# Patient Record
Sex: Male | Born: 1965 | Race: White | Hispanic: No | Marital: Married | Smoking: Former smoker
Health system: Southern US, Community
[De-identification: ages and names within clinical notes are randomized; demographics above are authoritative.]

## PROBLEM LIST (undated history)

## (undated) DIAGNOSIS — E785 Hyperlipidemia, unspecified: Secondary | ICD-10-CM

## (undated) DIAGNOSIS — M1990 Unspecified osteoarthritis, unspecified site: Secondary | ICD-10-CM

## (undated) DIAGNOSIS — T7840XA Allergy, unspecified, initial encounter: Secondary | ICD-10-CM

## (undated) DIAGNOSIS — B019 Varicella without complication: Secondary | ICD-10-CM

## (undated) DIAGNOSIS — R011 Cardiac murmur, unspecified: Secondary | ICD-10-CM

## (undated) HISTORY — DX: Unspecified osteoarthritis, unspecified site: M19.90

## (undated) HISTORY — DX: Varicella without complication: B01.9

## (undated) HISTORY — DX: Allergy, unspecified, initial encounter: T78.40XA

## (undated) HISTORY — DX: Cardiac murmur, unspecified: R01.1

## (undated) HISTORY — DX: Hyperlipidemia, unspecified: E78.5

## (undated) HISTORY — PX: LASIK: SHX215

---
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
## 2006-04-03 HISTORY — PX: OTHER SURGICAL HISTORY: SHX169

## 2010-12-09 ENCOUNTER — Ambulatory Visit (INDEPENDENT_AMBULATORY_CARE_PROVIDER_SITE_OTHER): Payer: 59 | Admitting: Family Medicine

## 2010-12-09 ENCOUNTER — Encounter: Payer: Self-pay | Admitting: Family Medicine

## 2010-12-09 DIAGNOSIS — M542 Cervicalgia: Secondary | ICD-10-CM

## 2010-12-09 DIAGNOSIS — E785 Hyperlipidemia, unspecified: Secondary | ICD-10-CM | POA: Insufficient documentation

## 2010-12-09 DIAGNOSIS — M7650 Patellar tendinitis, unspecified knee: Secondary | ICD-10-CM

## 2010-12-09 NOTE — Patient Instructions (Signed)
Consider icing knee 2-3 times daily for 20 to 30 minutes per application. We will call you regarding PT appointment.

## 2010-12-09 NOTE — Progress Notes (Signed)
  Subjective:    Patient ID: Damon Hughes, male    DOB: [birth year], 45 y.o.   MRN: [id number]  HPI New patient to establish care. Past medical history significant for hyperlipidemia. He has occasional seasonal allergies. Reported past history of heart murmur. He had some presumed arthritis in his neck but has never had MRI scan. Takes simvastatin 40 mg daily for hyperlipidemia. Also low-dose aspirin. Surgical history significant for appendectomy and tonsillectomy. Nasal polyp removal 2008.  Family history significant for father with psoriatic arthritis. Hyperlipidemia and hypertension in mother. Grandparent with stroke.  Patient is married. Works in [employer]. Nonsmoker. Occasional alcohol usually one glass of wine per day  Recent problems with right knee pain. Location is anterior knee. No injury. Tenderness over patellar tendon. Advil without much relief. Has not tried icing. No locking or giving way. Sometimes painful after prolonged periods of sitting. Does not feel pain is underneath the kneecap. No regular exercise.  Chronic neck pain. Intermittent episodes of tingling right upper extremity sometimes exacerbated by turning head. No weakness. Has tried Advil and stretches without relief. No history of physical therapy. Right-hand-dominant.   Review of Systems  Constitutional: Negative for fever, chills, appetite change and unexpected weight change.  HENT: Positive for neck pain. Negative for neck stiffness.   Eyes: Negative for visual disturbance.  Respiratory: Negative for cough and shortness of breath.   Cardiovascular: Negative for chest pain.  Gastrointestinal: Negative for abdominal pain.  Musculoskeletal: Positive for arthralgias. Negative for myalgias and gait problem.  Skin: Negative for rash.       Objective:   Physical Exam  Constitutional: He is oriented to person, place, and time. He appears well-developed and well-nourished.  HENT:  Right Ear:  External ear normal.  Left Ear: External ear normal.  Mouth/Throat: Oropharynx is clear and moist.  Eyes: Pupils are equal, round, and reactive to light.  Neck: Neck supple. No thyromegaly present.       Full range of motion neck. He has some soft tissue muscular tenderness right trapezius and right paracervical.  Cardiovascular: Normal rate, regular rhythm and normal heart sounds.   No murmur heard. Pulmonary/Chest: Effort normal and breath sounds normal. No respiratory distress. He has no wheezes. He has no rales.  Musculoskeletal: He exhibits no edema.  Lymphadenopathy:    He has no cervical adenopathy.  Neurological: He is alert and oriented to person, place, and time. No cranial nerve deficit.       Strength full upper extremities. Symmetric reflexes.  Right knee reveals no effusion. No erythema or warmth. Full range of motion. He has some mild prepatellar and patellar tenderness. No joint line tenderness. Ligament testing is normal          Assessment & Plan:  #1 right knee patellar tendinitis. Recommend regular icing. Continue Advil as needed. Consider addition of Voltaren gel if persists #2 right neck pain. Intermittent paresthesias right upper extremity.  Trial of physical therapy. #3 hyperlipidemia on simvastatin. Repeat labs with physical this fall

## 2011-01-04 ENCOUNTER — Other Ambulatory Visit (INDEPENDENT_AMBULATORY_CARE_PROVIDER_SITE_OTHER): Payer: 59

## 2011-01-04 DIAGNOSIS — Z0000 Encounter for general adult medical examination without abnormal findings: Secondary | ICD-10-CM

## 2011-01-04 LAB — LIPID PANEL
Cholesterol: 193 mg/dL (ref 0–200)
HDL: 46.1 mg/dL (ref >39.00)
LDL Cholesterol: 120 mg/dL — ABNORMAL HIGH (ref 0–99)
VLDL: 26.8 mg/dL (ref 0.0–40.0)

## 2011-01-04 LAB — POCT URINALYSIS DIPSTICK
Ketones, UA: NEGATIVE
Leukocytes, UA: NEGATIVE
Nitrite, UA: NEGATIVE
Protein, UA: NEGATIVE
Urobilinogen, UA: 0.2
pH, UA: 6

## 2011-01-04 LAB — CBC WITH DIFFERENTIAL/PLATELET
Eosinophils Relative: 3.6 % (ref 0.0–5.0)
HCT: 44.6 % (ref 39.0–52.0)
Hemoglobin: 14.5 g/dL (ref 13.0–17.0)
Lymphocytes Relative: 26.5 % (ref 12.0–46.0)
Lymphs Abs: 2 10*3/uL (ref 0.7–4.0)
Monocytes Relative: 7.3 % (ref 3.0–12.0)
Neutro Abs: 4.7 10*3/uL (ref 1.4–7.7)
WBC: 7.7 10*3/uL (ref 4.5–10.5)

## 2011-01-04 LAB — HEPATIC FUNCTION PANEL
ALT: 17 U/L (ref 0–53)
Bilirubin, Direct: 0 mg/dL (ref 0.0–0.3)
Total Protein: 7.3 g/dL (ref 6.0–8.3)

## 2011-01-04 LAB — BASIC METABOLIC PANEL
Calcium: 9.7 mg/dL (ref 8.4–10.5)
GFR: 86.33 mL/min (ref >60.00)
Potassium: 4.2 mEq/L (ref 3.5–5.1)
Sodium: 143 mEq/L (ref 135–145)

## 2011-01-04 LAB — TSH: TSH: 1.37 u[IU]/mL (ref 0.35–5.50)

## 2011-01-11 ENCOUNTER — Encounter: Payer: Self-pay | Admitting: Family Medicine

## 2011-01-11 ENCOUNTER — Ambulatory Visit (INDEPENDENT_AMBULATORY_CARE_PROVIDER_SITE_OTHER): Payer: 59 | Admitting: Family Medicine

## 2011-01-11 DIAGNOSIS — M25569 Pain in unspecified knee: Secondary | ICD-10-CM

## 2011-01-11 DIAGNOSIS — M25561 Pain in right knee: Secondary | ICD-10-CM

## 2011-01-11 DIAGNOSIS — E785 Hyperlipidemia, unspecified: Secondary | ICD-10-CM

## 2011-01-11 DIAGNOSIS — Z0000 Encounter for general adult medical examination without abnormal findings: Secondary | ICD-10-CM

## 2011-01-11 DIAGNOSIS — Z23 Encounter for immunization: Secondary | ICD-10-CM

## 2011-01-11 MED ORDER — SIMVASTATIN 40 MG PO TABS: 40.0000 mg | ORAL_TABLET | Freq: Every day | ORAL | Status: DC

## 2011-01-11 NOTE — Progress Notes (Signed)
Subjective:    Patient ID: Damon Hughes, male    DOB: 1965/06/05, 45 y.o.   MRN: [id number]  HPI Patient here for complete physical. Hyperlipidemia treated with simvastatin. No side effects. Needs refills. Last tetanus unknown but he thinks around 2004. No flu vaccine yet. Past medical history, family history, and social history reviewed as below.  No consistent exercise. Upper back pain and neck pain improved following physical therapy. Ongoing right knee pain. Pain is proximal patellar tendon region. Denies injury. Pain for several months is somewhat progressive. Icing without improvement. No effusion. Exacerbated by direct pressure on the knee.  Past Medical History  Diagnosis Date  . Arthritis   . Chicken pox   . Allergy   . Heart murmur   . Hyperlipidemia    Past Surgical History  Procedure Date  . Tonsillectomy 1974  . Appendectomy 1976  . Nasal polyps 2008  . Lasik     reports that he has quit smoking. He does not have any smokeless tobacco history on file. He reports that he drinks alcohol. He reports that he does not use illicit drugs. family history includes Alcohol abuse in his other; Arthritis in his father and other; Cancer in his other; Heart disease (age of onset:75) in his mother; Hyperlipidemia in his mother and other; Hypertension in his father and other; and Stroke in his other. No Known Allergies    Review of Systems  Constitutional: Negative for fever, activity change, appetite change, fatigue and unexpected weight change.  HENT: Negative for ear pain, congestion and trouble swallowing.   Eyes: Negative for pain and visual disturbance.  Respiratory: Negative for cough, shortness of breath and wheezing.   Cardiovascular: Negative for chest pain and palpitations.  Gastrointestinal: Negative for nausea, vomiting, abdominal pain, diarrhea, constipation, blood in stool, abdominal distention and rectal pain.  Genitourinary: Negative for dysuria, hematuria and  testicular pain.  Musculoskeletal: Negative for myalgias, back pain, joint swelling and arthralgias.  Skin: Negative for rash.  Neurological: Negative for dizziness, syncope and headaches.  Hematological: Negative for adenopathy.  Psychiatric/Behavioral: Negative for confusion and dysphoric mood.       Objective:   Physical Exam  Constitutional: He is oriented to person, place, and time. He appears well-developed and well-nourished. No distress.  HENT:  Head: Normocephalic and atraumatic.  Right Ear: External ear normal.  Left Ear: External ear normal.  Mouth/Throat: Oropharynx is clear and moist.  Eyes: Conjunctivae and EOM are normal. Pupils are equal, round, and reactive to light.  Neck: Normal range of motion. Neck supple. No thyromegaly present.  Cardiovascular: Normal rate, regular rhythm and normal heart sounds.   No murmur heard. Pulmonary/Chest: No respiratory distress. He has no wheezes. He has no rales.  Abdominal: Soft. Bowel sounds are normal. He exhibits no distension and no mass. There is no tenderness. There is no rebound and no guarding.  Musculoskeletal: He exhibits no edema.       Right knee excellent range of motion. No effusion. No medial or lateral joint line tenderness. Somewhat nodular thickening proximal patellar tendon with slight tenderness to palpation. No erythema or warmth. No ecchymosis.  Lymphadenopathy:    He has no cervical adenopathy.  Neurological: He is alert and oriented to person, place, and time. He displays normal reflexes. No cranial nerve deficit.  Skin: No rash noted.  Psychiatric: He has a normal mood and affect.          Assessment & Plan:  #1 health maintenance. Tetanus booster  given and flu vaccine given. Reviewed labs with patient. Minimally elevated lipids. Reduce saturated fats. Refill simvastatin for one year. Staples were consistent exercise. #2 right knee pain. Location is anterior knee and suspect patellar tendon. Not  improved with icing. Nodular thickening. Orthopedic referral.

## 2011-01-11 NOTE — Progress Notes (Signed)
Addended by: Melchor Amour on: 01/11/2011 09:53 AM   Modules accepted: Orders

## 2011-01-11 NOTE — Patient Instructions (Signed)
Low-Fat, Low-Saturated-Fat, Low-Cholesterol Diets Food Selection Guide BREADS, CEREALS, PASTA, RICE, DRIED PEAS, AND BEANS These products are high in carbohydrates and most are low in fat. Therefore, they can be increased in the diet as substitutes for fatty foods. They too, however, contain calories and should not be eaten in excess. Cereals can be eaten for snacks as well as for breakfast.  Include foods that contain fiber (fruits, vegetables, whole grains, and legumes). Research shows that fiber may lower blood cholesterol levels, especially the water-soluble fiber found in fruits, vegetables, oat products, and legumes. FRUITS AND VEGETABLES It is good to eat fruits and vegetables. Besides being sources of fiber, both are rich in vitamins and some minerals. They help you get the daily allowances of these nutrients. Fruits and vegetables can be used for snacks and desserts. MEATS Limit lean meat, chicken, [place], and fish to no more than 6 ounces per day. Beef, Pork, and Lamb  Use lean cuts of beef, pork, and lamb. Lean cuts include:   Extra-lean ground beef.   Arm roast.   Sirloin tip.   Center-cut ham.   Round steak.   Loin chops.   Rump roast.   Tenderloin.  Trim all fat off the outside of meats before cooking. It is not necessary to severely decrease the intake of red meat, but lean choices should be made. Lean meat is rich in protein and contains a highly absorbable form of iron. Premenopausal women, in particular, should avoid reducing lean red meat because this could increase the risk for low red blood cells (iron-deficiency anemia). Processed Meats Processed meats, such as bacon, bologna, salami, sausage, and hot dogs contain large quantities of fat, are not rich in valuable nutrients, and should not be eaten very often. Organ Meats The organ meats, such as liver, sweetbreads, kidneys, and brain are very rich in cholesterol. They should be limited. Chicken and [place] These  are good sources of protein. The fat of poultry can be reduced by removing the skin and underlying fat layers before cooking. Chicken and [place] can be substituted for lean red meat in the diet. Poultry should not be fried or covered with high-fat sauces. Fish and Shellfish Fish is a good source of protein. Shellfish contain cholesterol, but they usually are low in saturated fatty acids. The preparation of fish is important. Like chicken and [place], they should not be fried or covered with high-fat sauces. EGGS Egg yolks often are hidden in cooked and processed foods. Egg whites contain no fat or cholesterol. They can be eaten often. Try 1 to 2 egg whites instead of whole eggs in recipes or use egg substitutes that do not contain yolk. MILK AND DAIRY PRODUCTS Use skim or 1% milk instead of 2% or whole milk. Decrease whole milk, natural, and processed cheeses. Use nonfat or low-fat (2%) cottage cheese or low-fat cheeses made from vegetable oils. Choose nonfat or low-fat (1 to 2%) yogurt. Experiment with evaporated skim milk in recipes that call for heavy cream. Substitute low-fat yogurt or low-fat cottage cheese for sour cream in dips and salad dressings. Have at least 2 servings of low-fat dairy products, such as 2 glasses of skim (or 1%) milk each day to help get your daily calcium intake. FATS AND OILS Reduce the total intake of fats, especially saturated fat. Butterfat, lard, and beef fats are high in saturated fat and cholesterol. These should be avoided as much as possible. Vegetable fats do not contain cholesterol, but certain vegetable fats, such as  coconut oil, palm oil, and palm kernel oil are very high in saturated fats. These should be limited. These fats are often used in bakery goods, processed foods, popcorn, oils, and nondairy creamers. Vegetable shortenings and some peanut butters contain hydrogenated oils, which are also saturated fats. Read the labels on these foods and check for saturated  vegetable oils. Unsaturated vegetable oils and fats do not raise blood cholesterol. However, they should be limited because they are fats and are high in calories. Total fat should still be limited to 30% of your daily caloric intake. Desirable liquid vegetable oils are corn oil, cottonseed oil, olive oil, canola oil, safflower oil, soybean oil, and sunflower oil. Peanut oil is not as good, but small amounts are acceptable. Buy a heart-healthy tub margarine that has no partially hydrogenated oils in the ingredients. Mayonnaise and salad dressings often are made from unsaturated fats, but they should also be limited because of their high calorie and fat content. Seeds, nuts, peanut butter, olives, and avocados are high in fat, but the fat is mainly the unsaturated type. These foods should be limited mainly to avoid excess calories and fat. OTHER EATING TIPS Snacks  Most sweets should be limited as snacks. They tend to be rich in calories and fats, and their caloric content outweighs their nutritional value. Some good choices in snacks are graham crackers, melba toast, soda crackers, bagels (no egg), English muffins, fruits, and vegetables. These snacks are preferable to snack crackers, [place] fries, and chips. Popcorn should be air-popped or cooked in small amounts of liquid vegetable oil. Desserts Eat fruit, low-fat yogurt, and fruit ices instead of pastries, cake, and cookies. Sherbet, angel food cake, gelatin dessert, frozen low-fat yogurt, or other frozen products that do not contain saturated fat (pure fruit juice bars, frozen ice pops) are also acceptable.  COOKING METHODS Choose those methods that use little or no fat. They include:  Poaching.   Braising.   Steaming.   Grilling.   Baking.   Stir-frying.   Broiling.   Microwaving.  Foods can be cooked in a nonstick pan without added fat, or use a nonfat cooking spray in regular cookware. Limit fried foods and avoid frying in saturated  fat. Add moisture to lean meats by using water, broth, cooking wines, and other nonfat or low-fat sauces along with the cooking methods mentioned above. Soups and stews should be chilled after cooking. The fat that forms on top after a few hours in the refrigerator should be skimmed off. When preparing meals, avoid using excess salt. Salt can contribute to raising blood pressure in some people. EATING AWAY FROM HOME Order entres, potatoes, and vegetables without sauces or butter. When meat exceeds the size of a deck of cards (3 to 4 ounces), the rest can be taken home for another meal. Choose vegetable or fruit salads and ask for low-calorie salad dressings to be served on the side. Use dressings sparingly. Limit high-fat toppings, such as bacon, crumbled eggs, cheese, sunflower seeds, and olives. Ask for heart-healthy tub margarine instead of butter. Document Released: 09/09/2001 Document Re-Released: 06/14/2009 ExitCare Patient Information 2011 ExitCare, [facility] more regular exercise  Try to establish more consistent exercise.

## 2011-05-05 ENCOUNTER — Ambulatory Visit (INDEPENDENT_AMBULATORY_CARE_PROVIDER_SITE_OTHER): Payer: 59 | Admitting: Licensed Clinical Social Worker

## 2011-05-05 DIAGNOSIS — F4323 Adjustment disorder with mixed anxiety and depressed mood: Secondary | ICD-10-CM

## 2011-05-19 ENCOUNTER — Ambulatory Visit (INDEPENDENT_AMBULATORY_CARE_PROVIDER_SITE_OTHER): Payer: 59 | Admitting: Licensed Clinical Social Worker

## 2011-05-19 DIAGNOSIS — F4323 Adjustment disorder with mixed anxiety and depressed mood: Secondary | ICD-10-CM

## 2011-06-02 ENCOUNTER — Ambulatory Visit (INDEPENDENT_AMBULATORY_CARE_PROVIDER_SITE_OTHER): Payer: 59 | Admitting: Licensed Clinical Social Worker

## 2011-06-02 DIAGNOSIS — F4323 Adjustment disorder with mixed anxiety and depressed mood: Secondary | ICD-10-CM

## 2011-06-16 ENCOUNTER — Ambulatory Visit (INDEPENDENT_AMBULATORY_CARE_PROVIDER_SITE_OTHER): Payer: 59 | Admitting: Licensed Clinical Social Worker

## 2011-06-16 DIAGNOSIS — F4323 Adjustment disorder with mixed anxiety and depressed mood: Secondary | ICD-10-CM

## 2011-06-26 ENCOUNTER — Ambulatory Visit (INDEPENDENT_AMBULATORY_CARE_PROVIDER_SITE_OTHER): Payer: 59 | Admitting: Licensed Clinical Social Worker

## 2011-06-26 DIAGNOSIS — F4323 Adjustment disorder with mixed anxiety and depressed mood: Secondary | ICD-10-CM

## 2011-07-17 ENCOUNTER — Ambulatory Visit (INDEPENDENT_AMBULATORY_CARE_PROVIDER_SITE_OTHER): Payer: 59 | Admitting: Licensed Clinical Social Worker

## 2011-07-17 DIAGNOSIS — F4323 Adjustment disorder with mixed anxiety and depressed mood: Secondary | ICD-10-CM

## 2011-07-28 ENCOUNTER — Ambulatory Visit (INDEPENDENT_AMBULATORY_CARE_PROVIDER_SITE_OTHER): Payer: 59 | Admitting: Licensed Clinical Social Worker

## 2011-07-28 DIAGNOSIS — F4323 Adjustment disorder with mixed anxiety and depressed mood: Secondary | ICD-10-CM

## 2011-08-16 ENCOUNTER — Ambulatory Visit: Payer: 59 | Admitting: Licensed Clinical Social Worker

## 2011-12-22 ENCOUNTER — Other Ambulatory Visit: Payer: Self-pay | Admitting: Family Medicine

## 2012-02-27 ENCOUNTER — Other Ambulatory Visit: Payer: Self-pay | Admitting: Family Medicine

## 2012-02-27 ENCOUNTER — Other Ambulatory Visit: Payer: Self-pay | Admitting: *Deleted

## 2012-02-27 MED ORDER — SIMVASTATIN 40 MG PO TABS: 40.0000 mg | ORAL_TABLET | Freq: Every day | ORAL | Status: DC

## 2012-05-02 ENCOUNTER — Ambulatory Visit (INDEPENDENT_AMBULATORY_CARE_PROVIDER_SITE_OTHER): Payer: 59 | Admitting: Family Medicine

## 2012-05-02 VITALS — BP 124/88 | Temp 97.8°F | Wt 216.0 lb

## 2012-05-02 DIAGNOSIS — E785 Hyperlipidemia, unspecified: Secondary | ICD-10-CM

## 2012-05-02 LAB — HEPATIC FUNCTION PANEL
AST: 19 U/L (ref 0–37)
Albumin: 4.5 g/dL (ref 3.5–5.2)
Alkaline Phosphatase: 73 U/L (ref 39–117)
Total Protein: 7.5 g/dL (ref 6.0–8.3)

## 2012-05-02 LAB — LIPID PANEL
Cholesterol: 199 mg/dL (ref 0–200)
Triglycerides: 127 mg/dL (ref 0.0–149.0)

## 2012-05-02 MED ORDER — SIMVASTATIN 40 MG PO TABS: 40.0000 mg | ORAL_TABLET | Freq: Every day | ORAL | Status: DC

## 2012-05-02 NOTE — Patient Instructions (Addendum)
Try to lose some weight and monitor blood pressure with goal < 140/90

## 2012-05-02 NOTE — Progress Notes (Signed)
Quick Note:  Pt informed on personally identified VM ______ 

## 2012-05-02 NOTE — Progress Notes (Signed)
  Subjective:    Patient ID: Damon Hughes, male    DOB: May 19, 1965, 47 y.o.   MRN: [id number]  HPI Followup hyperlipidemia. Patient takes simvastatin 40 mg daily. No myalgias. No history of CAD. No history of hypertension. Has gained some weight over the past couple of years. No consistent exercise. Mother had coronary disease but she was in her 70s. Patient is nonsmoker. Denies any chest pains. No dizziness.  Past Medical History  Diagnosis Date  . Arthritis   . Chicken pox   . Allergy   . Heart murmur   . Hyperlipidemia    Past Surgical History  Procedure Date  . Tonsillectomy 1974  . Appendectomy 1976  . Nasal polyps 2008  . Lasik     reports that he has quit smoking. He does not have any smokeless tobacco history on file. He reports that he drinks alcohol. He reports that he does not use illicit drugs. family history includes Alcohol abuse in his other; Arthritis in his father and other; Cancer in his other; Heart disease (age of onset:75) in his mother; Hyperlipidemia in his mother and other; Hypertension in his father and other; and Stroke in his other. No Known Allergies    Review of Systems  Constitutional: Negative for fatigue and unexpected weight change.  Eyes: Negative for visual disturbance.  Respiratory: Negative for cough, chest tightness and shortness of breath.   Cardiovascular: Negative for chest pain, palpitations and leg swelling.  Neurological: Negative for dizziness, syncope, weakness, light-headedness and headaches.       Objective:   Physical Exam  Constitutional: He appears well-developed and well-nourished. No distress.  HENT:  Mouth/Throat: Oropharynx is clear and moist.  Neck: Neck supple. No thyromegaly present.  Cardiovascular: Normal rate and regular rhythm.   Pulmonary/Chest: Effort normal and breath sounds normal. No respiratory distress. He has no wheezes. He has no rales.  Musculoskeletal: He exhibits no edema.  Lymphadenopathy:   He has no cervical adenopathy.          Assessment & Plan:  Hyperlipidemia. Recheck lipid and hepatic panel. Refill simvastatin for one year Work on weight loss. Monitor blood pressure closely

## 2012-09-17 ENCOUNTER — Ambulatory Visit (INDEPENDENT_AMBULATORY_CARE_PROVIDER_SITE_OTHER)
Admission: RE | Admit: 2012-09-17 | Discharge: 2012-09-17 | Disposition: A | Discharge status: Home / Self Care | Payer: 59 | Source: Ambulatory Visit | Attending: Family Medicine | Admitting: Family Medicine

## 2012-09-17 ENCOUNTER — Encounter: Payer: Self-pay | Admitting: Family Medicine

## 2012-09-17 ENCOUNTER — Ambulatory Visit (INDEPENDENT_AMBULATORY_CARE_PROVIDER_SITE_OTHER): Payer: 59 | Admitting: Family Medicine

## 2012-09-17 VITALS — BP 120/90 | Temp 97.8°F | Wt 203.0 lb

## 2012-09-17 DIAGNOSIS — R0789 Other chest pain: Secondary | ICD-10-CM

## 2012-09-17 DIAGNOSIS — R071 Chest pain on breathing: Secondary | ICD-10-CM

## 2012-09-17 DIAGNOSIS — M25519 Pain in unspecified shoulder: Secondary | ICD-10-CM

## 2012-09-17 DIAGNOSIS — G8929 Other chronic pain: Secondary | ICD-10-CM

## 2012-09-17 MED ORDER — MELOXICAM 15 MG PO TABS: 15.0000 mg | ORAL_TABLET | Freq: Every day | ORAL | Status: DC

## 2012-09-17 NOTE — Progress Notes (Signed)
  Subjective:    Patient ID: Damon Hughes, male    DOB: [birth year], 47 y.o.   MRN: [id number]  HPI  Patient seen with 2-3 month history of left chest wall pain intermittently. Never related to exertion and in fact he's been walking and swimming some recently with no symptoms. Location is left upper chest wall region. Described as achy quality and relatively constant with exacerbations. No clear exacerbating factors. No history of injury. No pleuritic pain. Denies dyspnea or cough. No recent appetite or weight changes. Taken some Advil without much relief. Denies any left shoulder pain or left cervical radiculopathy pains  Patient also relates some persistent right periscapular pains. Worse with shoulder movement. No history of injury. Tried shoulder and back massage without improvement. Denies any upper extremity weakness.  Patient nonsmoker. Takes simvastatin for hyperlipidemia also baby aspirin one daily  Past Medical History  Diagnosis Date  . Arthritis   . Chicken pox   . Allergy   . Heart murmur   . Hyperlipidemia    Past Surgical History  Procedure Laterality Date  . Tonsillectomy  1974  . Appendectomy  1976  . Nasal polyps  2008  . Lasik      reports that he has quit smoking. He does not have any smokeless tobacco history on file. He reports that  drinks alcohol. He reports that he does not use illicit drugs. family history includes Alcohol abuse in his other; Arthritis in his father and other; Cancer in his other; Heart disease (age of onset: 38) in his mother; Hyperlipidemia in his mother and other; Hypertension in his father and other; and Stroke in his other. No Known Allergies   Review of Systems  Constitutional: Negative for fever, chills, appetite change, fatigue and unexpected weight change.  Respiratory: Negative for cough and shortness of breath.   Cardiovascular: Negative for palpitations and leg swelling.  Gastrointestinal: Negative for abdominal pain.   Musculoskeletal: Negative for joint swelling.  Skin: Negative for rash.  Neurological: Negative for dizziness.  Hematological: Negative for adenopathy.       Objective:   Physical Exam  Constitutional: He appears well-developed and well-nourished.  Neck: Neck supple. No thyromegaly present.  No cervical lymphadenopathy. No supraclavicular or axillary adenopathy  Cardiovascular: Normal rate and regular rhythm.   Pulmonary/Chest: Effort normal and breath sounds normal. No respiratory distress. He has no wheezes. He has no rales.  Chest wall is nontender to palpation. No breast mass  Abdominal: Soft. There is no tenderness.  Musculoskeletal: He exhibits no edema.  Full range of motion both shoulders. He has some minimal tenderness palpating medial aspect of right scapula. No evidence for scoliosis. No spinal tenderness.          Assessment & Plan:  #1 left chest wall pain. No history of any exertional symptoms, dyspnea, or other concerning findings by history or exam. Given duration of symptoms, chest x-ray though he does not have any significant respiratory symptoms. Suspect musculoskeletal origin. Trial of meloxicam 15 mg once daily and touch base 2 weeks if no improvement #2 right periscapular pain. Chest x-ray as above. Touch base if meloxicam not helping

## 2012-09-17 NOTE — Patient Instructions (Addendum)
Touch base in 2 weeks if pain not improved left chest wall or right scapular region.

## 2013-02-06 ENCOUNTER — Other Ambulatory Visit: Payer: Self-pay

## 2013-04-03 HISTORY — PX: WRIST SURGERY: SHX841

## 2013-04-17 ENCOUNTER — Other Ambulatory Visit: Payer: Self-pay | Admitting: Family Medicine

## 2013-05-08 ENCOUNTER — Encounter: Payer: Self-pay | Admitting: Family Medicine

## 2013-05-08 ENCOUNTER — Ambulatory Visit (INDEPENDENT_AMBULATORY_CARE_PROVIDER_SITE_OTHER): Payer: 59 | Admitting: Family Medicine

## 2013-05-08 VITALS — BP 128/82 | HR 79 | Temp 97.9°F | Wt 216.0 lb

## 2013-05-08 DIAGNOSIS — G8929 Other chronic pain: Secondary | ICD-10-CM

## 2013-05-08 DIAGNOSIS — M25511 Pain in right shoulder: Principal | ICD-10-CM

## 2013-05-08 DIAGNOSIS — M25519 Pain in unspecified shoulder: Secondary | ICD-10-CM

## 2013-05-08 NOTE — Progress Notes (Signed)
   Subjective:    Patient ID: Damon PatienceJeffrey W. Vogl, male    DOB: Aug 30, 1965, 48 y.o.   MRN: [id number]  Neck Pain  Pertinent negatives include no fever, numbness or weakness.   Patient seen with right periscapular pains. Present for several months. He denies specific injury. Occasional pains at the base of the cervical spine but mostly his pain  t involvinghe medial aspect of the right scapula and worse with movement (internal rotation) of the shoulder. He does not have any soreness to touch. This has been going on for several months and he has tried multiple things including nonsteroidals, heat, ice, muscle massage, physical therapy, heat patches, and topical icy hot without improvement. His symptoms are relatively stable. He does not have any significant pain at rest. No pleuritic pain. No dyspnea. No cough. No skin rashes.  He notices a" popping" sound when he rotates his right shoulder.  Past Medical History  Diagnosis Date  . Arthritis   . Chicken pox   . Allergy   . Heart murmur   . Hyperlipidemia    Past Surgical History  Procedure Laterality Date  . Tonsillectomy  1974  . Appendectomy  1976  . Nasal polyps  2008  . Lasik      reports that he has quit smoking. He does not have any smokeless tobacco history on file. He reports that he drinks alcohol. He reports that he does not use illicit drugs. family history includes Alcohol abuse in his other; Arthritis in his father and other; Cancer in his other; Heart disease (age of onset: 6075) in his mother; Hyperlipidemia in his mother and other; Hypertension in his father and other; Stroke in his other. No Known Allergies    Review of Systems  Constitutional: Negative for fever and chills.  Respiratory: Negative for cough and shortness of breath.   Musculoskeletal: Positive for neck pain.  Neurological: Negative for weakness and numbness.  Hematological: Negative for adenopathy.       Objective:   Physical Exam  Constitutional:  He appears well-developed and well-nourished.  Cardiovascular: Normal rate and regular rhythm.   Pulmonary/Chest: Effort normal and breath sounds normal. No respiratory distress. He has no wheezes. He has no rales.  Musculoskeletal:  Full range of motion right shoulder. He has no localized tenderness in the back region but has popping sensation when rotating the right shoulder on the medial aspect of the scapula.  Full range of motion cervical spine.  Neurological:  Full-strength upper extremities. Symmetric reflexes.  Skin: No rash noted.          Assessment & Plan:  Chronic, several month history of right medial periscapular pain.  Unimproved with multiple conservative therapies as above. Set up sports medicine referral for further evaluation

## 2013-05-08 NOTE — Progress Notes (Signed)
Pre visit review using our clinic review tool, if applicable. No additional management support is needed unless otherwise documented below in the visit note. 

## 2013-05-08 NOTE — Patient Instructions (Signed)
We will call you with Sports Medicine referral. 

## 2013-05-15 ENCOUNTER — Ambulatory Visit (INDEPENDENT_AMBULATORY_CARE_PROVIDER_SITE_OTHER): Payer: 59 | Admitting: Family Medicine

## 2013-05-15 ENCOUNTER — Encounter: Payer: Self-pay | Admitting: Family Medicine

## 2013-05-15 VITALS — BP 122/76 | HR 66 | Temp 98.1°F | Resp 16 | Wt 214.0 lb

## 2013-05-15 DIAGNOSIS — M9981 Other biomechanical lesions of cervical region: Secondary | ICD-10-CM

## 2013-05-15 DIAGNOSIS — M546 Pain in thoracic spine: Secondary | ICD-10-CM

## 2013-05-15 DIAGNOSIS — M999 Biomechanical lesion, unspecified: Secondary | ICD-10-CM | POA: Insufficient documentation

## 2013-05-15 DIAGNOSIS — M62838 Other muscle spasm: Secondary | ICD-10-CM

## 2013-05-15 NOTE — Assessment & Plan Note (Signed)
Running patient is having some neck spasm secondary to poor posture as well as working conditions. We discussed different home exercises as well as range of motion exercises they can be beneficial. We discussed over-the-counter medicines they can be helpful. Patient will try these different interventions and come back again in 2 weeks. Patient did respond well to osteopathic manipulation and will likely continue.

## 2013-05-15 NOTE — Progress Notes (Signed)
Pre-visit discussion using our clinic review tool. No additional management support is needed unless otherwise documented below in the visit note.  

## 2013-05-15 NOTE — Assessment & Plan Note (Signed)
Patient stated that this is the best is felt in multiple years after the osteopathic manipulation. Patient given home exercises to work on upper back musculature. Discussed icing for: Over-the-counter medicines. Patient will come back again in 2-3 weeks for further manipulation.

## 2013-05-15 NOTE — Patient Instructions (Signed)
Good to meet you Vitamin D 2000 IU daily.  Turmeric 500mg  Twice daily.  Exercises most days of the week focusing on upper back muscles such as rows Posture on wall 5 min goal a day with heels, butt, shoulder and head against wall.  Tennis ball between shoulder blades when sitting Change monitor to eye height.  Come back in 2 weeks.

## 2013-05-15 NOTE — Assessment & Plan Note (Signed)
Decision today to treat with OMT was based on Physical Exam  After verbal consent patient was treated with HVLA, ME and FPR techniques in cervical, thoracic and rib areas  Patient tolerated the procedure well with improvement in symptoms  Patient given exercises, stretches and lifestyle modifications  See medications in patient instructions if given  Patient will follow up in 2-3 weeks

## 2013-05-15 NOTE — Progress Notes (Signed)
  Tawana ScaleZach Kamarian Sahakian D.O. [facility] [address] [place], [place] [address] Phone: [phone] Subjective:    I'm seeing this patient by the request  of:  Kristian CoveyBURCHETTE,BRUCE W, MD   CC: neck pain and upper back pain.   GNF:[id number]:Subjective Damon PatienceJeffrey W. Perot is a 48 y.o. male coming in with complaint of upper back pain as well as neck pain for the last 2 years. Patient states that he remembers waking up one morning but does not remember any true injury. Patient states since that time he can has a chronic dull aching sensation in his right shoulder blade they can radiate up towards his neck. Patient states he has noticed he has had some trouble with range of motion of his neck. Patient has tried many things including over-the-counter medications and some prescription drugs from primary care provider without any significant improvement. Patient has done physical therapy and has even seated orthopedic physician for this previously. Patient states that it seems to have relieve the pain over the course of time. Patient denies any radiation to the arm or any numbness or weakness.     Past medical history, social, surgical and family history all reviewed in electronic medical record.   Review of Systems: No headache, visual changes, nausea, vomiting, diarrhea, constipation, dizziness, abdominal pain, skin rash, fevers, chills, night sweats, weight loss, swollen lymph nodes, body aches, joint swelling, muscle aches, chest pain, shortness of breath, mood changes.   Objective Blood pressure 122/76, pulse 66, temperature 98.1 F (36.7 C), temperature source Oral, resp. rate 16, weight 214 lb (97.07 kg), SpO2 96.00%.  General: No apparent distress alert and oriented x3 mood and affect normal, dressed appropriately.  HEENT: Pupils equal, extraocular movements intact  Respiratory: Patient's speak in full sentences and does not appear short of breath  Cardiovascular: No lower extremity edema, non tender, no  erythema  Skin: Warm dry intact with no signs of infection or rash on extremities or on axial skeleton.  Abdomen: Soft nontender  Neuro: Cranial nerves II through XII are intact, neurovascularly intact in all extremities with 2+ DTRs and 2+ pulses.  Lymph: No lymphadenopathy of posterior or anterior cervical chain or axillae bilaterally.  Gait normal with good balance and coordination.  MSK:  Non tender with full range of motion and good stability and symmetric strength and tone of shoulders, elbows, wrist, hip, knee and ankles bilaterally.  Neck: Inspection unremarkable. Patient does have some mild increase in lordosis No palpable stepoffs. Negative Spurling's maneuver. Mild limitation in range of motion with rotation bilaterally Grip strength and sensation normal in bilateral hands Strength good C4 to T1 distribution No sensory change to C4 to T1 Negative Hoffman sign bilaterally Reflexes normal  OMT Physical Exam  Standing structural       Occiput right higher.  Shoulder right higher    Standing flexion right  Seated Flexion right  Cervical  C2 flexed rotated and side bent right C4 flexed rotated and side bent left  Thoracic T3 extended rotated and side bent right with a elevated third rib T5 extended rotated and side bent right  Lumbar L2 flexed rotated inside that right      Impression and Recommendations:     This case required medical decision making of moderate complexity.

## 2013-05-29 ENCOUNTER — Ambulatory Visit: Payer: 59 | Admitting: Family Medicine

## 2013-06-03 ENCOUNTER — Encounter: Payer: Self-pay | Admitting: Family Medicine

## 2013-06-03 ENCOUNTER — Ambulatory Visit (INDEPENDENT_AMBULATORY_CARE_PROVIDER_SITE_OTHER): Payer: 59 | Admitting: Family Medicine

## 2013-06-03 VITALS — BP 126/72 | HR 76 | Temp 98.5°F | Resp 16 | Wt 214.0 lb

## 2013-06-03 DIAGNOSIS — M546 Pain in thoracic spine: Secondary | ICD-10-CM

## 2013-06-03 DIAGNOSIS — M9981 Other biomechanical lesions of cervical region: Secondary | ICD-10-CM

## 2013-06-03 DIAGNOSIS — M999 Biomechanical lesion, unspecified: Secondary | ICD-10-CM

## 2013-06-03 NOTE — Assessment & Plan Note (Signed)
Decision today to treat with OMT was based on Physical Exam  After verbal consent patient was treated with HVLA, ME and FPR techniques in cervical, thoracic and rib areas  Patient tolerated the procedure well with improvement in symptoms  Patient given exercises, stretches and lifestyle modifications  See medications in patient instructions if given  Patient will follow up in 3 weeks   

## 2013-06-03 NOTE — Patient Instructions (Signed)
Good to see you You  Are doing great! Y-T-A exercises 10 reps daily.  Continue everything else you are doing great.  Come back after your trip. Shoot a 69.

## 2013-06-03 NOTE — Assessment & Plan Note (Signed)
Decision today to treat with OMT was based on Physical Exam  After verbal consent patient was treated with HVLA, ME and FPR techniques in cervical, thoracic and rib areas  Patient tolerated the procedure well with improvement in symptoms  Patient given exercises, stretches and lifestyle modifications  See medications in patient instructions if given  Patient will follow up in 3 weeks

## 2013-06-03 NOTE — Assessment & Plan Note (Signed)
Patient is doing better but is encouraged to work more on his posture. Patient was given other home exercises we did show improper technique today. Spent greater than 25 minutes with patient face-to-face and had greater than 50% of counseling including as described above in assessment and plan. Continues to respond very well to osteopathic manipulation and will come back in 3 weeks for further evaluation and treatment

## 2013-06-03 NOTE — Progress Notes (Signed)
Pre visit review using our clinic review tool, if applicable. No additional management support is needed unless otherwise documented below in the visit note. 

## 2013-06-03 NOTE — Progress Notes (Signed)
  Tawana ScaleZach Smith D.O. [facility] [address] [place], [place] [address] Phone: [phone] Subjective:     CC: neck pain and upper back pain.   DGU:[id number]:Subjective Damon PatienceJeffrey W. Hughes is a 48 y.o. male coming in for followup of upper back pain. Patient had it for about 2 years and states that after the manipulation and he is having decrease in pain by about 85%. Patient states that he is able to be much more active at work it during the day and does have a lot more energy. Patient is also found that he has had decreased number of headaches. Patient is very happy with the results of our. The patient is doing the exercises intermittently through the week. Not taking any medicines for this pain. Patient is very happy with the results and would like to continue with manipulation.    Past medical history, social, surgical and family history all reviewed in electronic medical record.   Review of Systems: No headache, visual changes, nausea, vomiting, diarrhea, constipation, dizziness, abdominal pain, skin rash, fevers, chills, night sweats, weight loss, swollen lymph nodes, body aches, joint swelling, muscle aches, chest pain, shortness of breath, mood changes.   Objective Blood pressure 126/72, pulse 76, temperature 98.5 F (36.9 C), temperature source Oral, resp. rate 16, weight 214 lb 0.6 oz (97.088 kg), SpO2 97.00%.  General: No apparent distress alert and oriented x3 mood and affect normal, dressed appropriately.  HEENT: Pupils equal, extraocular movements intact  Respiratory: Patient's speak in full sentences and does not appear short of breath  Cardiovascular: No lower extremity edema, non tender, no erythema  Skin: Warm dry intact with no signs of infection or rash on extremities or on axial skeleton.  Abdomen: Soft nontender  Neuro: Cranial nerves II through XII are intact, neurovascularly intact in all extremities with 2+ DTRs and 2+ pulses.  Lymph: No lymphadenopathy of posterior  or anterior cervical chain or axillae bilaterally.  Gait normal with good balance and coordination.  MSK:  Non tender with full range of motion and good stability and symmetric strength and tone of shoulders, elbows, wrist, hip, knee and ankles bilaterally.  Neck: Inspection unremarkable. Patient does have some mild increase in lordosis No palpable stepoffs. Negative Spurling's maneuver. Mild limitation in range of motion with rotation bilaterally Grip strength and sensation normal in bilateral hands Strength good C4 to T1 distribution No sensory change to C4 to T1 Negative Hoffman sign bilaterally Reflexes normal  OMT Physical Exam   Standing flexion right  Seated Flexion right  Cervical  C2 flexed rotated and side bent right C4 flexed rotated and side bent left  Thoracic T2 extended rotated and side bent right with a elevated second rib T5 extended rotated and side bent right  Lumbar L2 flexed rotated inside that right      Impression and Recommendations:     This case required medical decision making of moderate complexity.

## 2013-06-24 ENCOUNTER — Ambulatory Visit (INDEPENDENT_AMBULATORY_CARE_PROVIDER_SITE_OTHER): Payer: 59 | Admitting: Family Medicine

## 2013-06-24 ENCOUNTER — Encounter: Payer: Self-pay | Admitting: Family Medicine

## 2013-06-24 VITALS — BP 128/84 | HR 64 | Wt 214.0 lb

## 2013-06-24 DIAGNOSIS — M546 Pain in thoracic spine: Secondary | ICD-10-CM

## 2013-06-24 DIAGNOSIS — M9981 Other biomechanical lesions of cervical region: Secondary | ICD-10-CM

## 2013-06-24 DIAGNOSIS — M999 Biomechanical lesion, unspecified: Secondary | ICD-10-CM

## 2013-06-24 MED ORDER — METHYLPREDNISOLONE ACETATE 80 MG/ML IJ SUSP
80.0000 mg | Freq: Once | INTRAMUSCULAR | Status: AC
  Administered 2013-06-24: 80 mg via INTRAMUSCULAR

## 2013-06-24 MED ORDER — KETOROLAC TROMETHAMINE 60 MG/2ML IM SOLN
60.0000 mg | Freq: Once | INTRAMUSCULAR | Status: AC
  Administered 2013-06-24: 60 mg via INTRAMUSCULAR

## 2013-06-24 NOTE — Assessment & Plan Note (Signed)
Decision today to treat with OMT was based on Physical Exam  After verbal consent patient was treated with HVLA, ME and FPR techniques in cervical, thoracic and rib and lumbar areas  Patient tolerated the procedure well with improvement in symptoms  Patient given exercises, stretches and lifestyle modifications  See medications in patient instructions if given  Patient will follow up in 3 weeks

## 2013-06-24 NOTE — Patient Instructions (Addendum)
Good to see you Two injections to help today.  Meloxicam daily for 10 days starting tomorrow Try exercises I am giving you 3 times a week and after a round of golf.  One knee down one up tilt pelvis forward (should feel in back and groin to stretch hip flexor) Ice 20 minutes after activity  With driving take a break every at least 2 hours.  Come back in 2-3 weeks.

## 2013-06-24 NOTE — Assessment & Plan Note (Signed)
Patient is having a mild flare overall. Patient encouraged take 48 hours off from the exercises them and start again for 3 times a week. Discuss proper lifting mechanics which could be beneficial. Medications per orders. Patient did respond osteopathic manipulation and will come back again for further evaluation and treatment.

## 2013-06-24 NOTE — Progress Notes (Signed)
  Tawana ScaleZach Smith D.O. [facility] [address] [place], [place] [address] Phone: [phone] Subjective:     CC: neck pain and upper back pain.   NFA:OZHYQMVHQIHPI:Subjective Camie PatienceJeffrey W. Blyth is a 48 y.o. male coming in for followup of upper back pain. Patient was doing extremely better but has a negative flare of his previous problem. Patient was playing golf a #15 called he went to step back and felt a sharp pain in his lower thoracic back. She states from a right-sided denies any radiation down the leg. Patient states though that Aleve and icing did not seem to improve. It started yesterday. Patient does have a large vacation planned where he is going to be playing golf every day. Patient would like to be able to do this. Patient is here to see if anything else can be done.    Past medical history, social, surgical and family history all reviewed in electronic medical record.   Review of Systems: No headache, visual changes, nausea, vomiting, diarrhea, constipation, dizziness, abdominal pain, skin rash, fevers, chills, night sweats, weight loss, swollen lymph nodes, body aches, joint swelling, muscle aches, chest pain, shortness of breath, mood changes.   Objective Blood pressure 128/84, pulse 64, weight 214 lb (97.07 kg), SpO2 98.00%.  General: No apparent distress alert and oriented x3 mood and affect normal, dressed appropriately.  HEENT: Pupils equal, extraocular movements intact  Respiratory: Patient's speak in full sentences and does not appear short of breath  Cardiovascular: No lower extremity edema, non tender, no erythema  Skin: Warm dry intact with no signs of infection or rash on extremities or on axial skeleton.  Abdomen: Soft nontender  Neuro: Cranial nerves II through XII are intact, neurovascularly intact in all extremities with 2+ DTRs and 2+ pulses.  Lymph: No lymphadenopathy of posterior or anterior cervical chain or axillae bilaterally.  Gait normal with good balance and  coordination.  MSK:  Non tender with full range of motion and good stability and symmetric strength and tone of shoulders, elbows, wrist, hip, knee and ankles bilaterally.  Back exam has full range of motion the patient is severely tender to palpation over the T12-L1 area. Muscle spasm in this area.  OMT Physical Exam   Standing flexion right  Seated Flexion right  Cervical  C2 flexed rotated and side bent right C4 flexed rotated and side bent left  Thoracic T2 extended rotated and side bent right with a elevated second rib T12 extended rotated and side bent right  Lumbar [id number] rotated inside that right      Impression and Recommendations:     This case required medical decision making of moderate complexity.

## 2013-06-24 NOTE — Assessment & Plan Note (Signed)
Decision today to treat with OMT was based on Physical Exam  After verbal consent patient was treated with HVLA, ME and FPR techniques in cervical, thoracic and rib areas  Patient tolerated the procedure well with improvement in symptoms  Patient given exercises, stretches and lifestyle modifications  See medications in patient instructions if given  Patient will follow up in 3 weeks

## 2013-06-24 NOTE — Assessment & Plan Note (Signed)
Decision today to treat with OMT was based on Physical Exam  After verbal consent patient was treated with HVLA, ME and FPR techniques in cervical, thoracic and rib and lumbar and sacral areas  Patient tolerated the procedure well with improvement in symptoms  Patient given exercises, stretches and lifestyle modifications  See medications in patient instructions if given  Patient will follow up in 3 weeks

## 2013-07-01 ENCOUNTER — Ambulatory Visit (INDEPENDENT_AMBULATORY_CARE_PROVIDER_SITE_OTHER): Payer: 59 | Admitting: Family Medicine

## 2013-07-01 ENCOUNTER — Encounter: Payer: Self-pay | Admitting: Family Medicine

## 2013-07-01 VITALS — BP 140/82 | HR 64

## 2013-07-01 DIAGNOSIS — M999 Biomechanical lesion, unspecified: Secondary | ICD-10-CM

## 2013-07-01 DIAGNOSIS — M9981 Other biomechanical lesions of cervical region: Secondary | ICD-10-CM

## 2013-07-01 DIAGNOSIS — M546 Pain in thoracic spine: Secondary | ICD-10-CM

## 2013-07-01 NOTE — Assessment & Plan Note (Signed)
Decision today to treat with OMT was based on Physical Exam  After verbal consent patient was treated with HVLA, ME and FPR techniques in cervical, thoracic and rib and lumbar and sacral areas  Patient tolerated the procedure well with improvement in symptoms  Patient given exercises, stretches and lifestyle modifications  See medications in patient instructions if given  Patient will follow up in 5 weeks  

## 2013-07-01 NOTE — Progress Notes (Signed)
  Tawana ScaleZach Smith D.O. [facility] [address] [place], [place] [address] Phone: [phone] Subjective:     CC: neck pain and upper back pain.   BJY:NWGNFAOZHYHPI:Subjective Camie PatienceJeffrey W. Botello is a 48 y.o. male coming in for followup of upper back pain.  Patient did go in golf in [place]. Patient states he did very well but started having some mild discomfort mostly in the low back and the right side as well as the upper back and the right side. Patient is right-hand golfer. No radiation to any extremities at the car ride was probably worse than the golfing. Patient denies any new symptoms though. Patient has been on the exercises fairly regularly as well as thickening over-the-counter medications with good benefit.    Past medical history, social, surgical and family history all reviewed in electronic medical record.   Review of Systems: No headache, visual changes, nausea, vomiting, diarrhea, constipation, dizziness, abdominal pain, skin rash, fevers, chills, night sweats, weight loss, swollen lymph nodes, body aches, joint swelling, muscle aches, chest pain, shortness of breath, mood changes.   Objective Blood pressure 140/82, pulse 64, SpO2 97.00%.  General: No apparent distress alert and oriented x3 mood and affect normal, dressed appropriately.  HEENT: Pupils equal, extraocular movements intact  Respiratory: Patient's speak in full sentences and does not appear short of breath  Cardiovascular: No lower extremity edema, non tender, no erythema  Skin: Warm dry intact with no signs of infection or rash on extremities or on axial skeleton.  Abdomen: Soft nontender  Neuro: Cranial nerves II through XII are intact, neurovascularly intact in all extremities with 2+ DTRs and 2+ pulses.  Lymph: No lymphadenopathy of posterior or anterior cervical chain or axillae bilaterally.  Gait normal with good balance and coordination.  MSK:  Non tender with full range of motion and good stability and  symmetric strength and tone of shoulders, elbows, wrist, hip, knee and ankles bilaterally.  Back exam has full range of motion the patient is severely tender to palpation over the T12-L1 area. Muscle spasm in this area.  OMT Physical Exam   Standing flexion right  Seated Flexion right  Cervical  C4 flexed rotated and side bent left  Thoracic T2 extended rotated and side bent right  T12 extended rotated and side bent right  Lumbar [id number] rotated inside that right   Sacrum right on right   Impression and Recommendations:     This case required medical decision making of moderate complexity.     Spent greater than 25 minutes with patient face-to-face and had greater than 50% of counseling including as described above in assessment and plan.

## 2013-07-01 NOTE — Patient Instructions (Signed)
Keep it up! You are doing great  See you at end of April.

## 2013-07-01 NOTE — Assessment & Plan Note (Signed)
Decision today to treat with OMT was based on Physical Exam  After verbal consent patient was treated with HVLA, ME and FPR techniques in cervical, thoracic and rib and lumbar and sacral areas  Patient tolerated the procedure well with improvement in symptoms  Patient given exercises, stretches and lifestyle modifications  See medications in patient instructions if given  Patient will follow up in 5 weeks

## 2013-07-01 NOTE — Assessment & Plan Note (Signed)
Patient did have mild exacerbation likely secondary to the increased golfing. Patient was doing 36 holes daily. Patient is doing considerably better and manipulation was much easier today. Encourage him to continue the exercises which he is doing very well. Patient will come back again in 5 weeks for further evaluation and treatment.

## 2013-07-15 ENCOUNTER — Ambulatory Visit (INDEPENDENT_AMBULATORY_CARE_PROVIDER_SITE_OTHER): Payer: 59 | Admitting: Family

## 2013-07-15 ENCOUNTER — Encounter: Payer: Self-pay | Admitting: Family

## 2013-07-15 VITALS — BP 114/80 | HR 78 | Wt 215.0 lb

## 2013-07-15 DIAGNOSIS — B079 Viral wart, unspecified: Secondary | ICD-10-CM

## 2013-07-15 NOTE — Progress Notes (Signed)
Pre visit review using our clinic review tool, if applicable. No additional management support is needed unless otherwise documented below in the visit note. 

## 2013-07-15 NOTE — Patient Instructions (Signed)
Cryosurgery for Skin Conditions, Care After  Refer to this sheet in the next few weeks. These instructions provide you with information on caring for yourself after your procedure. Your health care provider may also give you more specific instructions. Your treatment has been planned according to current medical practices, but problems sometimes occur. Call your health care provider if you have any problems or questions after your procedure.  WHAT TO EXPECT AFTER THE PROCEDURE  After your procedure, it is typical to have the following:   The treated area will become red and swollen shortly after the procedure.   Within 2 3 days, a blister will form over the treated area. The blister may contain a small amount of blood.   In about 2 weeks, the blister will break on its own, leaving a scab. The treated area will then heal. After healing, there is usually little or no scarring.  HOME CARE INSTRUCTIONS    Keep the treated area clean, dry, and covered with a bandage until healed. The area can be cleaned as usual with soap and water.   You may take showers. If your bandage gets wet, change it right away.   Do not pick at your blister or try to break it open. This can cause infection and scarring.   Do not apply any medicine, cream, or lotion to the treated area unless directed to do so by your health care provider.  SEEK MEDICAL CARE IF:    You have increased pain, swelling, redness, fluid drainage, or bleeding in the treated area.   Your blister becomes large and painful.  Document Released: 10/07/2004 Document Revised: 11/20/2012 Document Reviewed: 10/18/2012  Exit[employer] Patient Information 2014 [employer].

## 2013-07-16 NOTE — Progress Notes (Signed)
Subjective:    Patient ID: Damon Hughes, male    DOB: 11-10-1965, 48 y.o.   MRN: [id number]  HPI  48 year old white male, nonsmoker, patient appears in today with complaint of a plantar wart left foot present x4 days and worsening. He reports it is painful to walk on hard surfaces. Denies any drainage or discharge from the lesion.  Review of Systems  Constitutional: Negative.   HENT: Negative.   Respiratory: Negative.   Cardiovascular: Negative.   Skin:       Wart to the bottom of the left foot  Psychiatric/Behavioral: Negative.    Past Medical History  Diagnosis Date  . Arthritis   . Chicken pox   . Allergy   . Heart murmur   . Hyperlipidemia     History   Social History  . Marital Status: Married    Spouse Name: N/A    Number of Children: N/A  . Years of Education: N/A   Occupational History  . Not on file.   Social History Main Topics  . Smoking status: Former Games developermoker  . Smokeless tobacco: Not on file  . Alcohol Use: Yes  . Drug Use: No  . Sexual Activity:    Other Topics Concern  . Not on file   Social History Narrative  . No narrative on file    Past Surgical History  Procedure Laterality Date  . Tonsillectomy  1974  . Appendectomy  1976  . Nasal polyps  2008  . Lasik      Family History  Problem Relation Age of Onset  . Alcohol abuse Other   . Arthritis Other   . Cancer Other     breast  . Hyperlipidemia Other   . Stroke Other   . Hypertension Other   . Hyperlipidemia Mother   . Heart disease Mother [id number]    CABG  . Arthritis Father     psoriatic arthrititis  . Hypertension Father     No Known Allergies  Current Outpatient Prescriptions on File Prior to Visit  Medication Sig Dispense Refill  . aspirin 81 MG tablet Take 81 mg by mouth daily.        Marland Kitchen. glucosamine-chondroitin 500-400 MG tablet Take 2 tablets by mouth at bedtime.        . simvastatin (ZOCOR) 40 MG tablet TAKE 1 TABLET AT BEDTIME  90 tablet  1   No current  facility-administered medications on file prior to visit.    BP 114/80  Pulse 78  Wt 215 lb (97.523 kg)chart    Objective:   Physical Exam  Constitutional: He is oriented to person, place, and time. He appears well-developed and well-nourished.  Cardiovascular: Normal rate, regular rhythm and normal heart sounds.   Pulmonary/Chest: Effort normal and breath sounds normal.  Neurological: He is alert and oriented to person, place, and time.  Skin:  Viral wart to the plantar aspect the left foot.   Psychiatric: He has a normal mood and affect.     Cryotherapy: Informed consent was obtained and the site was treated with 20 % acetic acid. The color of the lesion changed to white with the application of acid. The patient tolerated the procedure well and  aftercare instructions were given to the patient.      Assessment & Plan:  Damon Hughes was seen today for wart on bottom of foot.  Diagnoses and associated orders for this visit:  Viral wart   Call the office with any questions or concerns.

## 2013-07-22 ENCOUNTER — Telehealth: Payer: Self-pay | Admitting: Family Medicine

## 2013-07-22 NOTE — Telephone Encounter (Signed)
Patient Information:  Caller Name: Damon Hughes  Phone: [phone]  Patient: Damon Hughes, Damon Hughes  Gender: Male  DOB: January 26, 1966  Age: 48 Years  PCP: Evelena PeatBurchette, Bruce (Family Practice)  Office Follow Up:  Does the office need to follow up with this patient?: No  Instructions For The Office: N/A   Symptoms  Reason For Call & Symptoms: Pt is calling to ask if he can take some of his daughters medication prescribed by the office. She had gone on a cruise and was prescribed Scopolamine 1mg  patch(transdermal). It worked well for the pt/she has some remaining. Dad is going [place] 58 [place] [address]. He will either take Dramamine with him or if possible/he would like to use one of these patches to prevent "sea sickness".  Reviewed Health History In EMR: N/A  Reviewed Medications In EMR: N/A  Reviewed Allergies In EMR: N/A  Reviewed Surgeries / Procedures: N/A  Date of Onset of Symptoms: 07/21/2013  Guideline(s) Used:  No Protocol Available - Information Only  Disposition Per Guideline:   Discuss with PCP and Callback by Nurse Today  Reason For Disposition Reached:   Nursing judgment  Advice Given:  N/A  Patient Will Follow Care Advice:  YES

## 2013-07-23 NOTE — Telephone Encounter (Signed)
OK to take if no hx of glaucoma or bowel obstruction.

## 2013-07-23 NOTE — Telephone Encounter (Signed)
Left detailed message on VM. Per Dr. Caryl NeverBurchette note

## 2013-07-25 ENCOUNTER — Encounter: Payer: Self-pay | Admitting: Family Medicine

## 2013-07-25 ENCOUNTER — Ambulatory Visit (INDEPENDENT_AMBULATORY_CARE_PROVIDER_SITE_OTHER): Payer: 59 | Admitting: Family Medicine

## 2013-07-25 VITALS — BP 122/80 | HR 68 | Wt 215.0 lb

## 2013-07-25 DIAGNOSIS — IMO0002 Reserved for concepts with insufficient information to code with codable children: Secondary | ICD-10-CM

## 2013-07-25 DIAGNOSIS — S90851A Superficial foreign body, right foot, initial encounter: Secondary | ICD-10-CM

## 2013-07-25 NOTE — Progress Notes (Signed)
Pre visit review using our clinic review tool, if applicable. No additional management support is needed unless otherwise documented below in the visit note. 

## 2013-07-25 NOTE — Progress Notes (Signed)
   Subjective:    Patient ID: Damon PatienceJeffrey W. Sealey, male    DOB: 06/01/1965, 48 y.o.   MRN: [id number]  HPI Patient seen with chief complaint of a" plantar wart" right foot. He was seen just couple weeks ago treated with liquid nitrogen but not improved. He did not have any significant blistering after treatment. He has pain with ambulation and this has gone on now for several months. He doesn't recall any obvious foreign body or injury. Tetanus 2012. He has not had any erythema or drainage  Past Medical History  Diagnosis Date  . Arthritis   . Chicken pox   . Allergy   . Heart murmur   . Hyperlipidemia    Past Surgical History  Procedure Laterality Date  . Tonsillectomy  1974  . Appendectomy  1976  . Nasal polyps  2008  . Lasik      reports that he has quit smoking. He does not have any smokeless tobacco history on file. He reports that he drinks alcohol. He reports that he does not use illicit drugs. family history includes Alcohol abuse in his other; Arthritis in his father and other; Cancer in his other; Heart disease (age of onset: 4375) in his mother; Hyperlipidemia in his mother and other; Hypertension in his father and other; Stroke in his other. No Known Allergies    Review of Systems  Constitutional: Negative for fever and chills.       Objective:   Physical Exam  Constitutional: He appears well-developed and well-nourished.  Cardiovascular: Normal rate and regular rhythm.   Skin:  Patient has a small callused area ventral aspect right foot over the third metatarsal head region. He denies any black spects consistent with plantar work. After trimming, we noted about 2-3 mm thin metallic foreign body which was removed.          Assessment & Plan:  Foreign body right foot. We used #15 blade to pare down callused area right foot. After trimming off hardened outer surface, approximately 2 mm very thin metallic foreign body was noted. We were able to grasp this with flat  teeth teeth pickups and remove in entirety. No signs of secondary infection. No visible foreign body retained. Topical antibiotic applied. Patient was able to ambulate with no pain afterwards . tetanus is up-to-date

## 2013-07-29 ENCOUNTER — Encounter: Payer: Self-pay | Admitting: Family Medicine

## 2013-07-29 ENCOUNTER — Ambulatory Visit (INDEPENDENT_AMBULATORY_CARE_PROVIDER_SITE_OTHER): Payer: 59 | Admitting: Family Medicine

## 2013-07-29 VITALS — BP 132/84 | HR 73

## 2013-07-29 DIAGNOSIS — M62838 Other muscle spasm: Secondary | ICD-10-CM

## 2013-07-29 DIAGNOSIS — M9981 Other biomechanical lesions of cervical region: Secondary | ICD-10-CM

## 2013-07-29 DIAGNOSIS — M999 Biomechanical lesion, unspecified: Secondary | ICD-10-CM

## 2013-07-29 NOTE — Assessment & Plan Note (Signed)
Decision today to treat with OMT was based on Physical Exam  After verbal consent patient was treated with HVLA, ME and FPR techniques in cervical, thoracic and lumbar and sacral areas  Patient tolerated the procedure well with improvement in symptoms  Patient given exercises, stretches and lifestyle modifications  See medications in patient instructions if given  Patient will follow up in 6 weeks

## 2013-07-29 NOTE — Assessment & Plan Note (Signed)
Patient is doing remarkably well. Patient continues to respond osteopathic manipulation. Encourage him to do home exercises on a regular basis. Patient will followup again in 6 weeks for further evaluation and treatment options

## 2013-07-29 NOTE — Progress Notes (Signed)
  Damon ScaleZach Lark Hughes D.O. [facility] [address] [place], [place] [address] Phone: [phone] Subjective:     CC: neck pain and upper back pain.   BJY:NWGNFAOZHYHPI:Subjective Damon PatienceJeffrey W. Hughes is a 48 y.o. male coming in for followup of upper back pain.  Patient has been traveling recently been doing a lot of fishing. Patient states his been feeling good with some mild soreness on the upper right side of the neck. Patient states though he is able to do all activities. Patient has not been doing exercises on a regular basis. Patient is not really taking any significant medications other than the over-the-counter medications we discussed previously. Overall patient is happy and able to do all activities of daily living without any significant discomfort.    Past medical history, social, surgical and family history all reviewed in electronic medical record.   Review of Systems: No headache, visual changes, nausea, vomiting, diarrhea, constipation, dizziness, abdominal pain, skin rash, fevers, chills, night sweats, weight loss, swollen lymph nodes, body aches, joint swelling, muscle aches, chest pain, shortness of breath, mood changes.   Objective There were no vitals taken for this visit.  General: No apparent distress alert and oriented x3 mood and affect normal, dressed appropriately.  HEENT: Pupils equal, extraocular movements intact  Respiratory: Patient's speak in full sentences and does not appear short of breath  Cardiovascular: No lower extremity edema, non tender, no erythema  Skin: Warm dry intact with no signs of infection or rash on extremities or on axial skeleton.  Abdomen: Soft nontender  Neuro: Cranial nerves II through XII are intact, neurovascularly intact in all extremities with 2+ DTRs and 2+ pulses.  Lymph: No lymphadenopathy of posterior or anterior cervical chain or axillae bilaterally.  Gait normal with good balance and coordination.  MSK:  Non tender with full range of  motion and good stability and symmetric strength and tone of shoulders, elbows, wrist, hip, knee and ankles bilaterally.  Back exam has full range of motion the patient is severely tender to palpation over the T12-L1 area. Muscle spasm in this area.  OMT Physical Exam   Cervical  C4 flexed rotated and side bent left C6 flexed rotated and side bent right  Thoracic T2 extended rotated and side bent right   Lumbar L631flexed rotated inside that right   Sacrum right on right   Impression and Recommendations:     This case required medical decision making of moderate complexity.     Spent greater than 25 minutes with patient face-to-face and had greater than 50% of counseling including as described above in assessment and plan.

## 2013-07-29 NOTE — Patient Instructions (Signed)
Good to see you as always.  Try to do the exercises 2-3 times a week.  See you in 6 weeks.

## 2013-09-08 ENCOUNTER — Encounter: Payer: Self-pay | Admitting: Family Medicine

## 2013-09-08 ENCOUNTER — Ambulatory Visit (INDEPENDENT_AMBULATORY_CARE_PROVIDER_SITE_OTHER): Payer: 59 | Admitting: Family Medicine

## 2013-09-08 VITALS — BP 124/84 | HR 67 | Ht 72.0 in | Wt 212.0 lb

## 2013-09-08 DIAGNOSIS — M546 Pain in thoracic spine: Secondary | ICD-10-CM

## 2013-09-08 DIAGNOSIS — M719 Bursopathy, unspecified: Secondary | ICD-10-CM

## 2013-09-08 DIAGNOSIS — M999 Biomechanical lesion, unspecified: Secondary | ICD-10-CM

## 2013-09-08 DIAGNOSIS — M9981 Other biomechanical lesions of cervical region: Secondary | ICD-10-CM

## 2013-09-08 DIAGNOSIS — M75101 Unspecified rotator cuff tear or rupture of right shoulder, not specified as traumatic: Secondary | ICD-10-CM

## 2013-09-08 DIAGNOSIS — M67919 Unspecified disorder of synovium and tendon, unspecified shoulder: Secondary | ICD-10-CM

## 2013-09-08 MED ORDER — MELOXICAM 15 MG PO TABS: 15.0000 mg | ORAL_TABLET | Freq: Every day | ORAL | Status: DC

## 2013-09-08 NOTE — Patient Instructions (Addendum)
Good to see you.  Continue the exercises  New exercises for the shoulder 3 times a week Meloxicam daily for 10 days then as needed Continue the medications See me again in 5 weeks for manipulation or 2-3 week if shoulder is not perfect.

## 2013-09-08 NOTE — Assessment & Plan Note (Signed)
Continues to respond well to conservative therapy. Patient was given another adjustment of home exercises and did respond again to osteopathic manipulation. Patient

## 2013-09-08 NOTE — Assessment & Plan Note (Signed)
Patient does have some mild impingement signs. Patient does not have any weakness and no signs of rotator cuff tear today. Patient given home exercise program with theraband. Patient will try to do these exercises regularly as well as an icing protocol. Patient will come back again in 3 weeks if continuing to have pain. At that time I would consider an intra-articular injection.

## 2013-09-08 NOTE — Assessment & Plan Note (Signed)
Decision today to treat with OMT was based on Physical Exam  After verbal consent patient was treated with HVLA, ME and FPR techniques in cervical, thoracic and lumbar and sacral areas  Patient tolerated the procedure well with improvement in symptoms  Patient given exercises, stretches and lifestyle modifications  See medications in patient instructions if given  Patient will follow up in 5-6 weeks

## 2013-09-08 NOTE — Progress Notes (Signed)
Damon ScaleZach Vallory Hughes D.O. [facility] [address] [place], [place] [address] Phone: [phone] Subjective:     CC: neck pain and upper back pain follow up.   BJY:NWGNFAOZHYHPI:Subjective Damon PatienceJeffrey W. Hughes is a 48 y.o. male coming in for followup of upper back pain.  Patient has been seen previously and has been responding very well to home exercises, over-the-counter medications, and osteopathic manipulation. Patient states he's been doing relatively well. Patient is describing some mild neck discomfort on the right side. No radiation down his arms or any numbness or weakness. Patient continues to do the exercises regularly as well as take the over-the-counter medications.  Patient does have a new problem. Patient has a recurrent right shoulder discomfort. Patient has not Seem me for this problem previously. Patient states it is more of a dull aching pain it hurts more with overhead activity or reaching behind his back. Denies any radiation down the arm or any numbness tingling or weakness. Patient states he can wake him up at night. Patient with the severity is 6/10. Moderately response to anti-inflammatories.    Past medical history, social, surgical and family history all reviewed in electronic medical record.   Review of Systems: No headache, visual changes, nausea, vomiting, diarrhea, constipation, dizziness, abdominal pain, skin rash, fevers, chills, night sweats, weight loss, swollen lymph nodes, body aches, joint swelling, muscle aches, chest pain, shortness of breath, mood changes.   Objective Blood pressure 124/84, pulse 67, height 6' (1.829 m), weight 212 lb (96.163 kg), SpO2 94.00%.  General: No apparent distress alert and oriented x3 mood and affect normal, dressed appropriately.  HEENT: Pupils equal, extraocular movements intact  Respiratory: Patient's speak in full sentences and does not appear short of breath  Cardiovascular: No lower extremity edema, non tender, no erythema  Skin:  Warm dry intact with no signs of infection or rash on extremities or on axial skeleton.  Abdomen: Soft nontender  Neuro: Cranial nerves II through XII are intact, neurovascularly intact in all extremities with 2+ DTRs and 2+ pulses.  Lymph: No lymphadenopathy of posterior or anterior cervical chain or axillae bilaterally.  Gait normal with good balance and coordination.  MSK:  Non tender with full range of motion and good stability and symmetric strength and tone of  elbows, wrist, hip, knee and ankles bilaterally.  Shoulder: Right Inspection reveals no abnormalities, atrophy or asymmetry. Palpation is normal with no tenderness over AC joint or bicipital groove. ROM is full in all planes. Rotator cuff strength normal throughout.  signs of impingement with positive Neer and Hawkin's tests, empty can sign. Speeds and Yergason's tests normal. No labral pathology noted with negative Obrien's, negative clunk and good stability. Normal scapular function observed. No painful arc and no drop arm sign. No apprehension sign    OMT Physical Exam   Cervical  C4 flexed rotated and side bent left C6 flexed rotated and side bent right  Thoracic T2 extended rotated and side bent right elevated second rib  Lumbar L691flexed rotated inside that right   Sacrum right on right   Impression and Recommendations:     This case required medical decision making of moderate complexity.  Problem List Items Addressed This Visit   Thoracic back pain - Primary     Continues to respond well to conservative therapy. Patient was given another adjustment of home exercises and did respond again to osteopathic manipulation. Patient    Relevant Medications      meloxicam (MOBIC) tablet  Spent greater than 25 minutes with patient face-to-face and had greater than 50% of counseling including as described above in assessment and plan.

## 2013-09-08 NOTE — Assessment & Plan Note (Signed)
Decision today to treat with OMT was based on Physical Exam  After verbal consent patient was treated with HVLA, ME and FPR techniques in cervical, thoracic and lumbar and sacral areas  Patient tolerated the procedure well with improvement in symptoms  Patient given exercises, stretches and lifestyle modifications  See medications in patient instructions if given  Patient will follow up in 6 weeks

## 2013-09-21 ENCOUNTER — Other Ambulatory Visit: Payer: Self-pay | Admitting: Family Medicine

## 2013-11-27 IMAGING — CR DG CHEST 2V
2 series · 2 of 2 positions shown · non-contrast
Comparison: None.

CLINICAL DATA: Chest pain

CHEST - 2 VIEW

[view not recorded (1 of 2)]
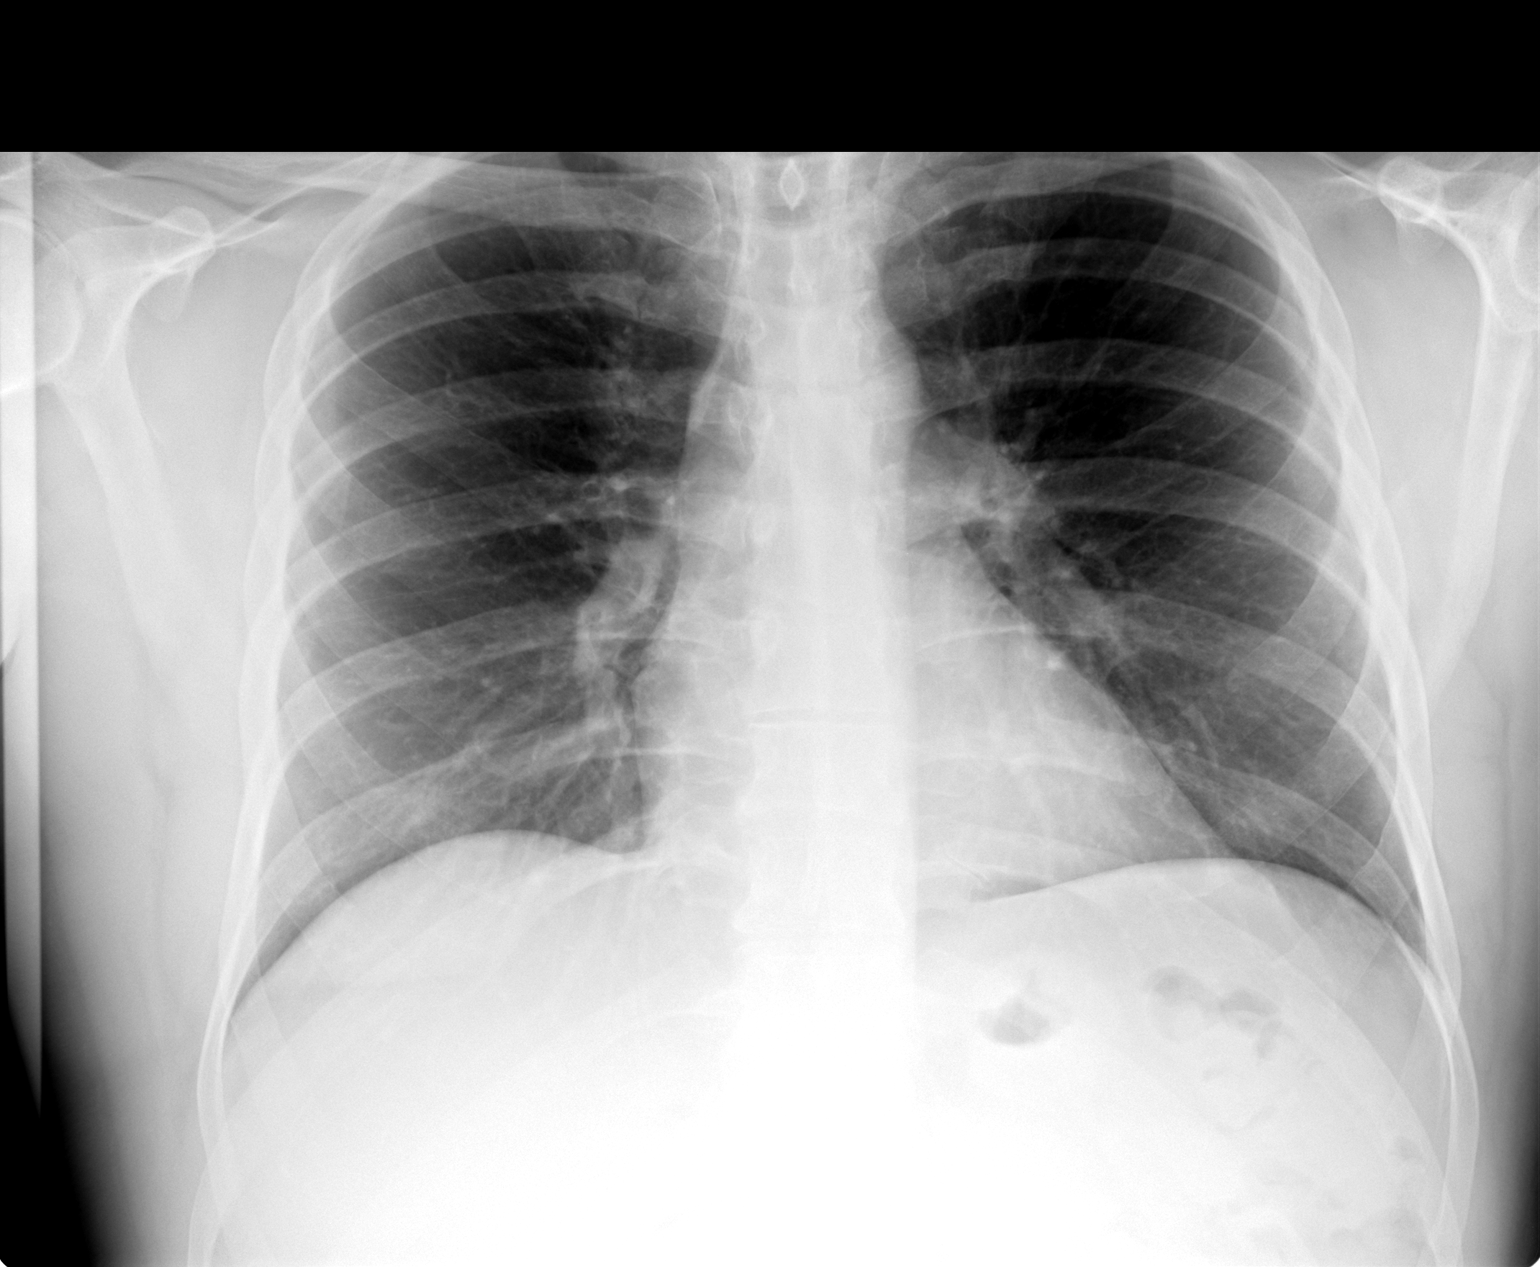

[view not recorded (2 of 2)]
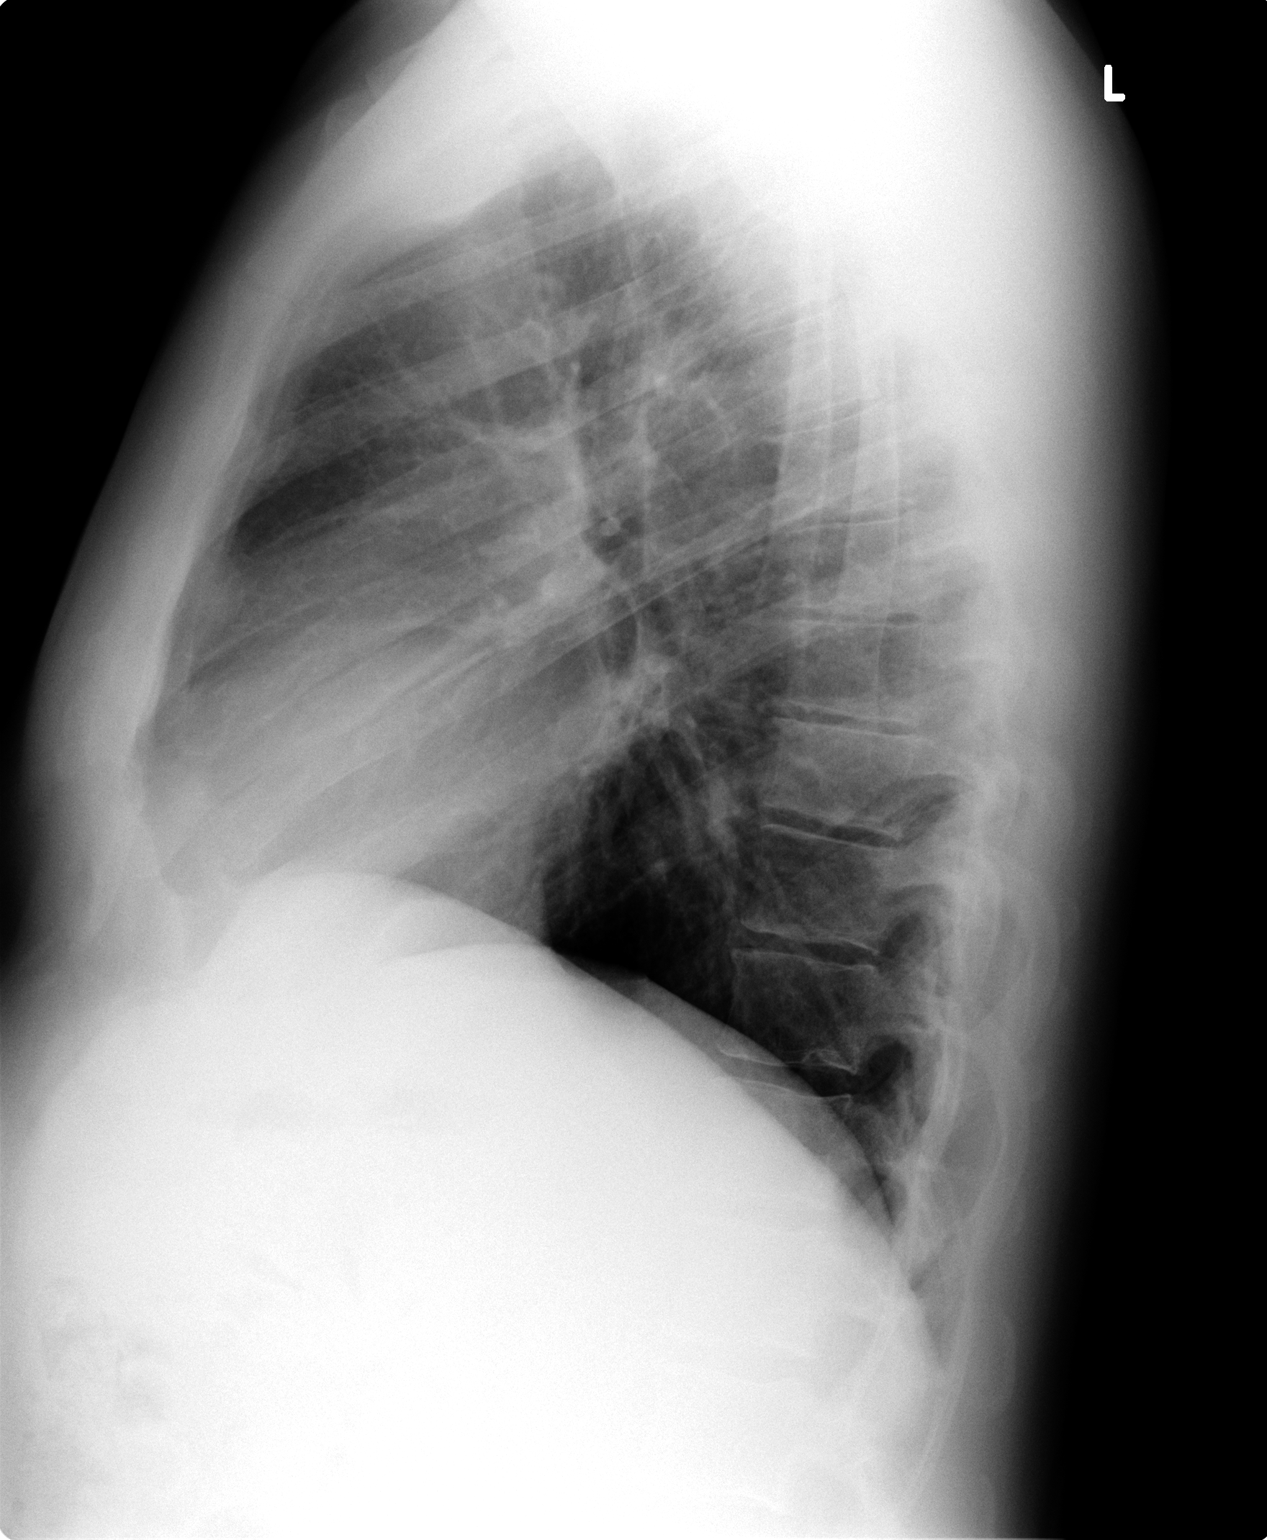

[2 of 2 positions shown; findings below may reference images not displayed]

FINDINGS: Lungs clear.  Heart size and pulmonary vascularity are
normal.  No adenopathy.  No bone lesions.  No pneumothorax.
IMPRESSION: No abnormality noted.

## 2013-12-22 ENCOUNTER — Encounter: Payer: Self-pay | Admitting: Family Medicine

## 2013-12-22 ENCOUNTER — Ambulatory Visit (INDEPENDENT_AMBULATORY_CARE_PROVIDER_SITE_OTHER): Payer: 59 | Admitting: Family Medicine

## 2013-12-22 VITALS — BP 124/70 | HR 60 | Wt 213.0 lb

## 2013-12-22 DIAGNOSIS — S63509A Unspecified sprain of unspecified wrist, initial encounter: Secondary | ICD-10-CM

## 2013-12-22 DIAGNOSIS — S66911A Strain of unspecified muscle, fascia and tendon at wrist and hand level, right hand, initial encounter: Secondary | ICD-10-CM

## 2013-12-22 NOTE — Progress Notes (Signed)
Pre visit review using our clinic review tool, if applicable. No additional management support is needed unless otherwise documented below in the visit note. 

## 2013-12-22 NOTE — Patient Instructions (Signed)
Get a wrist splint and be in touch in the next 2-3 weeks if not improving.

## 2013-12-22 NOTE — Progress Notes (Signed)
   Subjective:    Patient ID: Damon Hughes, male    DOB: 1965/10/19, 48 y.o.   MRN: [id number]  Wrist Pain    Acute visit. Right wrist pain. Onset Saturday. He was doing some lifting of objects and felt a popping sensation ulnar aspect of wrist. He continued to do lift. By the next day had some swelling and soreness. Pain is confined to the ulnar region of the wrist. No numbness. No weakness  Past Medical History  Diagnosis Date  . Arthritis   . Chicken pox   . Allergy   . Heart murmur   . Hyperlipidemia    Past Surgical History  Procedure Laterality Date  . Tonsillectomy  1974  . Appendectomy  1976  . Nasal polyps  2008  . Lasik      reports that he has quit smoking. He does not have any smokeless tobacco history on file. He reports that he drinks alcohol. He reports that he does not use illicit drugs. family history includes Alcohol abuse in his other; Arthritis in his father and other; Cancer in his other; Heart disease (age of onset: 62) in his mother; Hyperlipidemia in his mother and other; Hypertension in his father and other; Stroke in his other. No Known Allergies    Review of Systems  Neurological: Negative for weakness and headaches.       Objective:   Physical Exam  Constitutional: He appears well-developed and well-nourished.  Cardiovascular: Normal rate and regular rhythm.   Musculoskeletal:  Right wrist reveals no ecchymosis. No warmth. No bony tenderness. He has some tenderness to palpation along the ulnar collateral ligament          Assessment & Plan:  Right wrist pain. Question ligament strain. We've recommended short-arm wrist splint for the next couple of weeks. Touch base in 2-3 weeks if not improving. He'll try Advil or Aleve for anti-inflammatory effect.

## 2014-01-06 ENCOUNTER — Encounter: Payer: Self-pay | Admitting: Family Medicine

## 2014-01-06 ENCOUNTER — Ambulatory Visit (INDEPENDENT_AMBULATORY_CARE_PROVIDER_SITE_OTHER): Payer: 59 | Admitting: Family Medicine

## 2014-01-06 VITALS — BP 120/80 | HR 64 | Wt 212.0 lb

## 2014-01-06 DIAGNOSIS — M25531 Pain in right wrist: Secondary | ICD-10-CM

## 2014-01-06 NOTE — Progress Notes (Signed)
Pre visit review using our clinic review tool, if applicable. No additional management support is needed unless otherwise documented below in the visit note. 

## 2014-01-06 NOTE — Progress Notes (Signed)
   Subjective:    Patient ID: Damon Hughes, male    DOB: 05-Oct-1965, 48 y.o.   MRN: [id number]  Wrist Pain  Pertinent negatives include no numbness.   Patient seen for followup right wrist injury. Refer to prior note From 12-22-13:  "Acute visit. Right wrist pain. Onset Saturday. He was doing some lifting of objects and felt a popping sensation ulnar aspect of wrist. He continued to do lift. By the next day had some swelling and soreness. Pain is confined to the ulnar region of the wrist. No numbness. No weakness"  We suspected possible ligament strain and patient was placed in wrist splint and took some over-the-counter anti-inflammatories. He does not have any swelling at this time but did have some occasional sharp pains and occasional" popping sensation"along ulnar aspect of wrist near the ulnar styloid.  Past Medical History  Diagnosis Date  . Arthritis   . Chicken pox   . Allergy   . Heart murmur   . Hyperlipidemia    Past Surgical History  Procedure Laterality Date  . Tonsillectomy  1974  . Appendectomy  1976  . Nasal polyps  2008  . Lasik      reports that he has quit smoking. He does not have any smokeless tobacco history on file. He reports that he drinks alcohol. He reports that he does not use illicit drugs. family history includes Alcohol abuse in his other; Arthritis in his father and other; Cancer in his other; Heart disease (age of onset: 7375) in his mother; Hyperlipidemia in his mother and other; Hypertension in his father and other; Stroke in his other. No Known Allergies   Review of Systems  Neurological: Negative for weakness and numbness.       Objective:   Physical Exam  Constitutional: He appears well-developed and well-nourished. No distress.  Musculoskeletal:  Right wrist reveals no edema. No ecchymosis. No warmth. Full range of motion with flexion, extension, radial, and ulnar deviation. He has some tenderness near the distal ulnar styloid with  ulnar deviation.  Full strength wrist.          Assessment & Plan:  Persistent right ulnar wrist pain-? Ligament injury. Continue wrist splint. Set up orthopedic referral with hand specialist and patient agrees with plan.  May need MRI to further assess.  Will defer to orthopedics.

## 2014-01-06 NOTE — Patient Instructions (Signed)
We will call you with orthopedic referral. 

## 2014-06-03 ENCOUNTER — Ambulatory Visit (INDEPENDENT_AMBULATORY_CARE_PROVIDER_SITE_OTHER): Payer: 59 | Admitting: Family Medicine

## 2014-06-03 ENCOUNTER — Encounter: Payer: Self-pay | Admitting: Family Medicine

## 2014-06-03 VITALS — BP 108/74 | HR 94 | Wt 218.0 lb

## 2014-06-03 DIAGNOSIS — M9908 Segmental and somatic dysfunction of rib cage: Secondary | ICD-10-CM

## 2014-06-03 DIAGNOSIS — M9901 Segmental and somatic dysfunction of cervical region: Secondary | ICD-10-CM

## 2014-06-03 DIAGNOSIS — M9902 Segmental and somatic dysfunction of thoracic region: Secondary | ICD-10-CM

## 2014-06-03 DIAGNOSIS — M999 Biomechanical lesion, unspecified: Secondary | ICD-10-CM

## 2014-06-03 DIAGNOSIS — M62838 Other muscle spasm: Secondary | ICD-10-CM

## 2014-06-03 DIAGNOSIS — M6248 Contracture of muscle, other site: Secondary | ICD-10-CM

## 2014-06-03 MED ORDER — CYCLOBENZAPRINE HCL 10 MG PO TABS: 10.0000 mg | ORAL_TABLET | Freq: Three times a day (TID) | ORAL | Status: DC | PRN

## 2014-06-03 MED ORDER — KETOROLAC TROMETHAMINE 60 MG/2ML IM SOLN
60.0000 mg | Freq: Once | INTRAMUSCULAR | Status: AC
  Administered 2014-06-03: 60 mg via INTRAMUSCULAR

## 2014-06-03 MED ORDER — METHYLPREDNISOLONE ACETATE 80 MG/ML IJ SUSP
80.0000 mg | Freq: Once | INTRAMUSCULAR | Status: AC
  Administered 2014-06-03: 80 mg via INTRAMUSCULAR

## 2014-06-03 NOTE — Progress Notes (Signed)
Pre visit review using our clinic review tool, if applicable. No additional management support is needed unless otherwise documented below in the visit note. 

## 2014-06-03 NOTE — Progress Notes (Signed)
Tawana ScaleZach Smith D.O. [facility] [address] [place], [place] [address] Phone: [phone] Subjective:     CC: neck pain and upper back pain follow up.   QMV:HQIONGEXBMHPI:Subjective Camie PatienceJeffrey W. Tony is a 49 y.o. male coming in for followup of upper back pain.  Patient has been seen previously and has been responding very well to home exercises, over-the-counter medications, and osteopathic manipulation. Patient has not been seen for 7 months. Patient is having worsening pain mostly in the right shoulder area. Patient states that it is more over the rib as well as the muscle spasm. Patient states that it is very difficulty even at night to roll over on her side. States that the pain is severe.  Patient at last exam also had rotator cuff syndrome of the right shoulder. Patient was given home exercises and icing protocol and topical anti-inflammatories. Patient states shoulder overall seems to be doing relatively well but it still seems to be more of the back spasm seems to be giving of the discomfort. Denies any radiation down the arm or any numbness or tingling. Rates the severity pain is 8 out of 10. Has not tried any over-the-counter medications.    Past medical history, social, surgical and family history all reviewed in electronic medical record.   Review of Systems: No headache, visual changes, nausea, vomiting, diarrhea, constipation, dizziness, abdominal pain, skin rash, fevers, chills, night sweats, weight loss, swollen lymph nodes, body aches, joint swelling, muscle aches, chest pain, shortness of breath, mood changes.   Objective Blood pressure 108/74, pulse 94, weight 218 lb (98.884 kg), SpO2 98 %.  General: No apparent distress alert and oriented x3 mood and affect normal, dressed appropriately.  HEENT: Pupils equal, extraocular movements intact  Respiratory: Patient's speak in full sentences and does not appear short of breath  Cardiovascular: No lower extremity edema, non tender,  no erythema  Skin: Warm dry intact with no signs of infection or rash on extremities or on axial skeleton.  Abdomen: Soft nontender  Neuro: Cranial nerves II through XII are intact, neurovascularly intact in all extremities with 2+ DTRs and 2+ pulses.  Lymph: No lymphadenopathy of posterior or anterior cervical chain or axillae bilaterally.  Gait normal with good balance and coordination.  MSK:  Non tender with full range of motion and good stability and symmetric strength and tone of  elbows, wrist, hip, knee and ankles bilaterally.  Neck: Inspection unremarkable. No palpable stepoffs. Negative Spurling's maneuver. Patient lacks last 5 of flexion the last 10 of extension. Severely tender to palpation over the trapezius muscle on the right side. Trigger point noted Grip strength and sensation normal in bilateral hands Strength good C4 to T1 distribution No sensory change to C4 to T1 Negative Hoffman sign bilaterally Reflexes normal Shoulder: Right Inspection reveals no abnormalities, atrophy or asymmetry. Palpation is normal with no tenderness over AC joint or bicipital groove. ROM is full in all planes. Rotator cuff strength normal throughout.  signs of impingement with positive Neer and Hawkin's tests, empty can sign. Speeds and Yergason's tests normal. No labral pathology noted with negative Obrien's, negative clunk and good stability. Normal scapular function observed. No painful arc and no drop arm sign. No apprehension sign    OMT Physical Exam  Cervical  C4 flexed rotated and side bent left C6 flexed rotated and side bent right  Thoracic T2 extended rotated and side bent right elevated second rib  Lumbar L841flexed rotated inside that right   Sacrum right  on right   Impression and Recommendations:     This case required medical decision making of moderate complexity.

## 2014-06-03 NOTE — Assessment & Plan Note (Signed)
Decision today to treat with OMT was based on Physical Exam  After verbal consent patient was treated with HVLA, ME and FPR techniques in cervical, thoracic and lumbar and sacral areas  Patient tolerated the procedure well with improvement in symptoms  Patient given exercises, stretches and lifestyle modifications  See medications in patient instructions if given  Patient will follow up in 2 weeks

## 2014-06-03 NOTE — Assessment & Plan Note (Signed)
Patient is having more of a muscle spasm as well as the trapezius muscle spasm. Trigger point is noted. Patient elected try to osteopathic manipulation we discussed icing regimen. Patient is to continue with the over-the-counter natural supplementations patient given trial of topical anti-inflammatories, 3 day burst of oral anti-implant was, and patient was given 2 injections to decrease this pain overall. Patient will come back and see me again in 2 weeks. Patient also continuing to have pain and we will have him in for trigger point injections.

## 2014-06-03 NOTE — Patient Instructions (Addendum)
Good to see you Damon Hughes 20 minutes 2 times daily. Usually after activity and before bed. Exercises 3 times a week.  Try the pennsaid twice daily Flexeril at night 2 injections today to help See me again in 2 weeks.

## 2014-06-03 NOTE — Addendum Note (Signed)
Addended by: Edwena FeltyARSON, LINDSAY T on: 06/03/2014 04:44 PM   Modules accepted: Orders

## 2014-06-17 ENCOUNTER — Encounter: Payer: Self-pay | Admitting: Family Medicine

## 2014-06-17 ENCOUNTER — Ambulatory Visit (INDEPENDENT_AMBULATORY_CARE_PROVIDER_SITE_OTHER): Payer: 59 | Admitting: Family Medicine

## 2014-06-17 VITALS — BP 110/84 | HR 66 | Ht 72.0 in

## 2014-06-17 DIAGNOSIS — M9908 Segmental and somatic dysfunction of rib cage: Secondary | ICD-10-CM

## 2014-06-17 DIAGNOSIS — M9902 Segmental and somatic dysfunction of thoracic region: Secondary | ICD-10-CM

## 2014-06-17 DIAGNOSIS — M9901 Segmental and somatic dysfunction of cervical region: Secondary | ICD-10-CM

## 2014-06-17 DIAGNOSIS — M546 Pain in thoracic spine: Secondary | ICD-10-CM

## 2014-06-17 DIAGNOSIS — M999 Biomechanical lesion, unspecified: Secondary | ICD-10-CM

## 2014-06-17 NOTE — Assessment & Plan Note (Signed)
Patient encouraged to continue to the postural exercises and the home exercises. We discussed the icing regimen. We discussed other changes patient can do ergonomically when driving that could be beneficial. Patient will come back and see me again in 6-8 weeks.

## 2014-06-17 NOTE — Progress Notes (Signed)
  Tawana ScaleZach Denim Start D.O. [facility] [address] [place], [place] [address] Phone: [phone] Subjective:     CC: neck pain and upper back pain follow up.   BJY:NWGNFAOZHYHPI:Subjective Damon PatienceJeffrey W. Hughes is a 49 y.o. male coming in for followup of upper back pain.  Patient has been seen previously and has been responding very well to home exercises, over-the-counter medications, and osteopathic manipulation. Patient states after the last exam in the flare he was having he is approximately 75% better. Still has some mild back discomfort but has been doing the exercises fairly regularly. Patient is going out of town. Patient denies any new symptoms.    Past medical history, social, surgical and family history all reviewed in electronic medical record.   Review of Systems: No headache, visual changes, nausea, vomiting, diarrhea, constipation, dizziness, abdominal pain, skin rash, fevers, chills, night sweats, weight loss, swollen lymph nodes, body aches, joint swelling, muscle aches, chest pain, shortness of breath, mood changes.   Objective There were no vitals taken for this visit.  General: No apparent distress alert and oriented x3 mood and affect normal, dressed appropriately.  HEENT: Pupils equal, extraocular movements intact  Respiratory: Patient's speak in full sentences and does not appear short of breath  Cardiovascular: No lower extremity edema, non tender, no erythema  Skin: Warm dry intact with no signs of infection or rash on extremities or on axial skeleton.  Abdomen: Soft nontender  Neuro: Cranial nerves II through XII are intact, neurovascularly intact in all extremities with 2+ DTRs and 2+ pulses.  Lymph: No lymphadenopathy of posterior or anterior cervical chain or axillae bilaterally.  Gait normal with good balance and coordination.  MSK:  Non tender with full range of motion and good stability and symmetric strength and tone of  elbows, wrist, hip, knee and ankles bilaterally.    Neck: Inspection unremarkable. No palpable stepoffs. Negative Spurling's maneuver. Grip strength and sensation normal in bilateral hands Strength good C4 to T1 distribution No sensory change to C4 to T1 Negative Hoffman sign bilaterally Reflexes normal     OMT Physical Exam  Cervical  C4 flexed rotated and side bent left C6 flexed rotated and side bent right  Thoracic T2 extended rotated and side bent right elevated second rib  Lumbar [id number] rotated inside that right   Sacrum right on right Same pattern   Impression and Recommendations:     This case required medical decision making of moderate complexity.

## 2014-06-17 NOTE — Patient Instructions (Signed)
You are doing great! Significant improvement Take medicine if needed Continue the exercises See me again in 4-6 weeks.

## 2014-06-17 NOTE — Progress Notes (Signed)
Pre visit review using our clinic review tool, if applicable. No additional management support is needed unless otherwise documented below in the visit note. 

## 2014-06-17 NOTE — Assessment & Plan Note (Signed)
Decision today to treat with OMT was based on Physical Exam  After verbal consent patient was treated with HVLA, ME and FPR techniques in cervical, thoracic and lumbar and sacral areas  Patient tolerated the procedure well with improvement in symptoms  Patient given exercises, stretches and lifestyle modifications  See medications in patient instructions if given  Patient will follow up in 4-6 weeks

## 2014-08-24 ENCOUNTER — Other Ambulatory Visit: Payer: Self-pay | Admitting: Family Medicine

## 2015-02-18 ENCOUNTER — Other Ambulatory Visit: Payer: Self-pay | Admitting: Family Medicine

## 2015-05-18 ENCOUNTER — Encounter: Payer: Self-pay | Admitting: Family Medicine

## 2015-05-18 ENCOUNTER — Encounter: Payer: Self-pay | Admitting: Internal Medicine

## 2015-05-18 ENCOUNTER — Ambulatory Visit (INDEPENDENT_AMBULATORY_CARE_PROVIDER_SITE_OTHER): Payer: 59 | Admitting: Family Medicine

## 2015-05-18 VITALS — BP 124/84 | HR 76 | Temp 98.0°F | Ht 72.0 in | Wt 195.0 lb

## 2015-05-18 DIAGNOSIS — Z0000 Encounter for general adult medical examination without abnormal findings: Secondary | ICD-10-CM | POA: Diagnosis not present

## 2015-05-18 DIAGNOSIS — Z23 Encounter for immunization: Secondary | ICD-10-CM

## 2015-05-18 LAB — BASIC METABOLIC PANEL
BUN: 15 mg/dL (ref 6–23)
CHLORIDE: 105 meq/L (ref 96–112)
CO2: 31 mEq/L (ref 19–32)
Calcium: 9.9 mg/dL (ref 8.4–10.5)
Creatinine, Ser: 1 mg/dL (ref 0.40–1.50)
GFR: 83.79 mL/min (ref >60.00)
Glucose, Bld: 100 mg/dL — ABNORMAL HIGH (ref 70–99)
POTASSIUM: 5.1 meq/L (ref 3.5–5.1)
SODIUM: 143 meq/L (ref 135–145)

## 2015-05-18 LAB — CBC WITH DIFFERENTIAL/PLATELET
BASOS PCT: 0.7 % (ref 0.0–3.0)
Basophils Absolute: 0 10*3/uL (ref 0.0–0.1)
EOS ABS: 0.2 10*3/uL (ref 0.0–0.7)
EOS PCT: 3.5 % (ref 0.0–5.0)
HCT: 44.7 % (ref 39.0–52.0)
HEMOGLOBIN: 14.9 g/dL (ref 13.0–17.0)
LYMPHS ABS: 1.7 10*3/uL (ref 0.7–4.0)
Lymphocytes Relative: 27.6 % (ref 12.0–46.0)
MCHC: 33.2 g/dL (ref 30.0–36.0)
MCV: 91.3 fl (ref 78.0–100.0)
MONO ABS: 0.5 10*3/uL (ref 0.1–1.0)
Monocytes Relative: 7.9 % (ref 3.0–12.0)
NEUTROS ABS: 3.7 10*3/uL (ref 1.4–7.7)
NEUTROS PCT: 60.3 % (ref 43.0–77.0)
PLATELETS: 335 10*3/uL (ref 150.0–400.0)
RBC: 4.9 Mil/uL (ref 4.22–5.81)
RDW: 14.2 % (ref 11.5–15.5)
WBC: 6.2 10*3/uL (ref 4.0–10.5)

## 2015-05-18 LAB — LIPID PANEL
CHOLESTEROL: 134 mg/dL (ref 0–200)
HDL: 51 mg/dL (ref >39.00)
LDL CALC: 70 mg/dL (ref 0–99)
NonHDL: 82.8
Total CHOL/HDL Ratio: 3
Triglycerides: 63 mg/dL (ref 0.0–149.0)
VLDL: 12.6 mg/dL (ref 0.0–40.0)

## 2015-05-18 LAB — TSH: TSH: 1.38 u[IU]/mL (ref 0.35–4.50)

## 2015-05-18 LAB — HEPATIC FUNCTION PANEL
ALBUMIN: 4.9 g/dL (ref 3.5–5.2)
ALT: 20 U/L (ref 0–53)
AST: 23 U/L (ref 0–37)
Alkaline Phosphatase: 70 U/L (ref 39–117)
BILIRUBIN TOTAL: 0.7 mg/dL (ref 0.2–1.2)
Bilirubin, Direct: 0.1 mg/dL (ref 0.0–0.3)
Total Protein: 7 g/dL (ref 6.0–8.3)

## 2015-05-18 LAB — PSA: PSA: 0.84 ng/mL (ref 0.10–4.00)

## 2015-05-18 NOTE — Progress Notes (Signed)
Subjective:    Patient ID: Damon Hughes, male    DOB: Apr 10, 1965, 50 y.o.   MRN: [id number]  HPI  Here for complete physical.  Hyperlipidemia treated with simvastatin.  He and his wife recently joined [employer]. They've also joined a gym and plan to start regular exercise soon.  No flu vaccine. Tetanus up-to-date. Just turned 49 last year.  Needs screening colonoscopy. Never smoked.   mom had coronary disease in her 25s  Father had atrial fibrillation around age 51  Past Medical History  Diagnosis Date  . Arthritis   . Chicken pox   . Allergy   . Heart murmur   . Hyperlipidemia    Past Surgical History  Procedure Laterality Date  . Tonsillectomy  1974  . Appendectomy  1976  . Nasal polyps  2008  . Lasik      reports that he has quit smoking. He does not have any smokeless tobacco history on file. He reports that he drinks alcohol. He reports that he does not use illicit drugs. family history includes Alcohol abuse in his other; Arthritis in his father and other; Cancer in his other; Heart disease (age of onset: 47) in his mother; Hyperlipidemia in his mother and other; Hypertension in his father and other; Stroke in his other. No Known Allergies    Review of Systems  Constitutional: Negative for fever, activity change, appetite change and fatigue.  HENT: Negative for congestion, ear pain and trouble swallowing.   Eyes: Negative for pain and visual disturbance.  Respiratory: Negative for cough, shortness of breath and wheezing.   Cardiovascular: Negative for chest pain and palpitations.  Gastrointestinal: Negative for nausea, vomiting, abdominal pain, diarrhea, constipation, blood in stool, abdominal distention and rectal pain.  Genitourinary: Negative for dysuria, hematuria and testicular pain.  Musculoskeletal: Negative for joint swelling and arthralgias.  Skin: Negative for rash.  Neurological: Negative for dizziness, syncope and headaches.  Hematological:  Negative for adenopathy.  Psychiatric/Behavioral: Negative for confusion and dysphoric mood.       Objective:   Physical Exam  Constitutional: He is oriented to person, place, and time. He appears well-developed and well-nourished. No distress.  HENT:  Head: Normocephalic and atraumatic.  Right Ear: External ear normal.  Left Ear: External ear normal.  Mouth/Throat: Oropharynx is clear and moist.  Eyes: Conjunctivae and EOM are normal. Pupils are equal, round, and reactive to light.  Neck: Normal range of motion. Neck supple. No thyromegaly present.  Cardiovascular: Normal rate, regular rhythm and normal heart sounds.   No murmur heard. Pulmonary/Chest: No respiratory distress. He has no wheezes. He has no rales.  Abdominal: Soft. Bowel sounds are normal. He exhibits no distension and no mass. There is no tenderness. There is no rebound and no guarding.  Genitourinary: Rectum normal and prostate normal.  Prostate is normal size. No nodules.  Musculoskeletal: He exhibits no edema.  Lymphadenopathy:    He has no cervical adenopathy.  Neurological: He is alert and oriented to person, place, and time. He displays normal reflexes. No cranial nerve deficit.  Skin: No rash noted.  Psychiatric: He has a normal mood and affect.          Assessment & Plan:  Physical exam. Flu vaccine given. Schedule screening colonoscopy. Screening lab work ordered. Continue regular exercise and weight loss efforts The natural history of prostate cancer and ongoing controversy regarding screening and potential treatment outcomes of prostate cancer has been discussed with the patient. The meaning of  a false positive PSA and a false negative PSA has been discussed. He indicates understanding of the limitations of this screening test and wishes to proceed with screening PSA testing.

## 2015-05-18 NOTE — Progress Notes (Signed)
Pre visit review using our clinic review tool, if applicable. No additional management support is needed unless otherwise documented below in the visit note. 

## 2015-06-15 ENCOUNTER — Telehealth: Payer: Self-pay | Admitting: Family Medicine

## 2015-06-15 MED ORDER — SIMVASTATIN 40 MG PO TABS: 40.0000 mg | ORAL_TABLET | Freq: Every day | ORAL | Status: DC

## 2015-06-15 NOTE — Telephone Encounter (Signed)
Medication sent in for patient and he is aware to continue.

## 2015-06-15 NOTE — Telephone Encounter (Signed)
Pt request refill of the following: simvastatin (ZOCOR) 40 MG tablet  90 day supply   Pt said his number came down he is asking if he should still be taking the 40mg    Phamacy: [facility] mail order

## 2015-07-02 ENCOUNTER — Encounter: Payer: 59 | Admitting: Internal Medicine

## 2015-08-11 ENCOUNTER — Ambulatory Visit (AMBULATORY_SURGERY_CENTER): Payer: Self-pay | Admitting: *Deleted

## 2015-08-11 VITALS — Ht 72.0 in | Wt 181.0 lb

## 2015-08-11 DIAGNOSIS — Z1211 Encounter for screening for malignant neoplasm of colon: Secondary | ICD-10-CM

## 2015-08-11 MED ORDER — NA SULFATE-K SULFATE-MG SULF 17.5-3.13-1.6 GM/177ML PO SOLN: ORAL | Status: AC

## 2015-08-11 NOTE — Progress Notes (Signed)
Patient denies any allergies to eggs or soy. Patient denies any problems with anesthesia/sedation. Patient denies any oxygen use at home and does not take any diet/weight loss medications. EMMI education assisgned to patient on colonoscopy, this was explained and instructions given to patient. 

## 2015-08-12 ENCOUNTER — Encounter: Payer: Self-pay | Admitting: Internal Medicine

## 2015-08-25 ENCOUNTER — Encounter: Payer: 59 | Admitting: Internal Medicine

## 2016-02-03 ENCOUNTER — Encounter: Payer: Self-pay | Admitting: Medical

## 2016-02-03 ENCOUNTER — Ambulatory Visit (INDEPENDENT_AMBULATORY_CARE_PROVIDER_SITE_OTHER): Payer: 59 | Admitting: Medical

## 2016-02-03 VITALS — BP 118/80 | HR 61 | Temp 97.0°F | Ht 72.0 in | Wt 186.6 lb

## 2016-02-03 DIAGNOSIS — H6690 Otitis media, unspecified, unspecified ear: Secondary | ICD-10-CM

## 2016-02-03 DIAGNOSIS — J3089 Other allergic rhinitis: Secondary | ICD-10-CM

## 2016-02-03 DIAGNOSIS — J3489 Other specified disorders of nose and nasal sinuses: Secondary | ICD-10-CM | POA: Diagnosis not present

## 2016-02-03 MED ORDER — AMOXICILLIN-POT CLAVULANATE 875-125 MG PO TABS: 1.0000 | ORAL_TABLET | Freq: Two times a day (BID) | ORAL | 0 refills | Status: AC

## 2016-02-03 MED ORDER — FLUTICASONE PROPIONATE 50 MCG/ACT NA SUSP: 2.0000 | Freq: Every day | NASAL | 1 refills | Status: AC

## 2016-02-03 NOTE — Progress Notes (Signed)
Pre visit review using our clinic review tool, if applicable. No additional management support is needed unless otherwise documented below in the visit note. 

## 2016-02-03 NOTE — Patient Instructions (Addendum)
You appear to have allergic rhinitis  presently. Now appear possible early sinus infection and left ear infection following the congestion.  For nasal congestion will rx flonase.  Also start augmentin for the ear infection and possible early sinus infection as well.  Please let us know if your ear pressure/pain increases. In some cases we give injection antibiotics for resistant infection.  Follow up in 7 days or as needed

## 2016-02-03 NOTE — Progress Notes (Signed)
Subjective:    Patient ID: Damon Hughes, male    DOB: [birth year], 50 y.o.   MRN: [id number]  HPI  Pt in with some nasal congestion and some ear pain recenty. Pt nasal congested since Monday. Some sneezing. No itching eyes. Some colored mucous when he blows his nose. No fever, no chills or sweats. No upper teeth. Pt denies hx of chronic sinus infections. Ear pain last night moderate and sharp.  No hx of ear infection or as adult.   Review of Systems  Constitutional: Negative for chills, fatigue and fever.       St mild on Tuesday.  HENT: Positive for congestion, ear pain, sinus pressure and sneezing. Negative for postnasal drip, tinnitus and trouble swallowing.   Respiratory: Negative for cough, chest tightness, shortness of breath and wheezing.   Cardiovascular: Negative for chest pain and palpitations.  Gastrointestinal: Negative for abdominal pain.  Musculoskeletal: Negative for back pain and myalgias.  Neurological: Negative for dizziness and headaches.  Hematological: Negative for adenopathy. Does not bruise/bleed easily.  Psychiatric/Behavioral: Negative for behavioral problems and confusion.    Past Medical History:  Diagnosis Date  . Allergy   . Arthritis   . Chicken pox   . Heart murmur   . Hyperlipidemia      Social History   Social History  . Marital status: Married    Spouse name: N/A  . Number of children: N/A  . Years of education: N/A   Occupational History  . Not on file.   Social History Main Topics  . Smoking status: Former Games developermoker  . Smokeless tobacco: Never Used  . Alcohol use Yes     Comment: beer occasional   . Drug use: No  . Sexual activity: Not on file   Other Topics Concern  . Not on file   Social History Narrative  . No narrative on file    Past Surgical History:  Procedure Laterality Date  . APPENDECTOMY  1976  . LASIK    . nasal polyps  2008  . TONSILLECTOMY  1974  . WRIST SURGERY  2015    Family History  Problem  Relation Age of Onset  . Alcohol abuse Other   . Arthritis Other   . Cancer Other     breast  . Hyperlipidemia Other   . Stroke Other   . Hypertension Other   . Hyperlipidemia Mother   . Heart disease Mother [id number]    CABG  . Arthritis Father     psoriatic arthrititis  . Hypertension Father   . Colon cancer Neg Hx     No Known Allergies  Current Outpatient Prescriptions on File Prior to Visit  Medication Sig Dispense Refill  . aspirin 81 MG tablet Take 81 mg by mouth daily.      Marland Kitchen. glucosamine-chondroitin 500-400 MG tablet Take 2 tablets by mouth at bedtime.      . Na Sulfate-K Sulfate-Mg Sulf SOLN Suprep (no substitutions)-TAKE AS DIRECTED. 354 mL 0  . simvastatin (ZOCOR) 40 MG tablet Take 1 tablet (40 mg total) by mouth at bedtime. 90 tablet 2  . Turmeric 450 MG CAPS Take 450 capsules by mouth daily.     No current facility-administered medications on file prior to visit.     BP 118/80 (BP Location: Left Arm, Patient Position: Sitting)   Pulse 61   Temp 97 F (36.1 C) (Oral)   Ht 6' (1.829 m)   Wt 186 lb 9.6 oz (84.6 kg)  SpO2 97%   BMI 25.31 kg/m       Objective:   Physical Exam  General  Mental Status - Alert. General Appearance - Well groomed. Not in acute distress.  Skin Rashes- No Rashes.  HEENT Head- Normal. Ear Auditory Canal - Left- Normal. Right - Normal.Tympanic Membrane- Left- moderate bright redness in center of tm Right- Normal. Eye Sclera/Conjunctiva- Left- Normal. Right- Normal. Nose & Sinuses Nasal Mucosa- Left-  Boggy and Congested. Right-  Boggy and  Congested.Bilateral maxillary and frontal sinus pressure. Mouth & Throat Lips: Upper Lip- Normal: no dryness, cracking, pallor, cyanosis, or vesicular eruption. Lower Lip-Normal: no dryness, cracking, pallor, cyanosis or vesicular eruption. Buccal Mucosa- Bilateral- No Aphthous ulcers. Oropharynx- No Discharge or Erythema. Tonsils: Characteristics- Bilateral- No Erythema or Congestion.  Size/Enlargement- Bilateral- No enlargement. Discharge- bilateral-None.  Neck Neck- Supple. No Masses.   Chest and Lung Exam Auscultation: Breath Sounds:-Clear even and unlabored.  Cardiovascular Auscultation:Rythm- Regular, rate and rhythm. Murmurs & Other Heart Sounds:Ausculatation of the heart reveal- No Murmurs.  Lymphatic Head & Neck General Head & Neck Lymphatics: Bilateral: Description- No Localized lymphadenopathy.       Assessment & Plan:  You appear to have allergic rhinitis  presently. Now appear possible early sinus infection and left ear infection following the congestion.  For nasal congestion will rx flonase.  Also start augmentin for the  ear infection and possible early sinus infection as well.  Please let us know if your ear pressure/pain increases. In some cases we give injection antibiotics for resistant infection.  Follow up in 7 days or as needed  Jhamir Pickup, Ramon DredgeEdward, VF CorporationPA-C

## 2016-02-21 ENCOUNTER — Telehealth: Payer: Self-pay | Admitting: Family Medicine

## 2016-02-21 MED ORDER — SIMVASTATIN 40 MG PO TABS: 40.0000 mg | ORAL_TABLET | Freq: Every day | ORAL | 2 refills | Status: DC

## 2016-02-21 NOTE — Telephone Encounter (Signed)
° ° °  Pt request refill of the following:  simvastatin (ZOCOR) 40 MG tablet    Pt has a new pharmacy    [facility] mail order    A new rx will need to be sent per the pt

## 2016-02-21 NOTE — Telephone Encounter (Signed)
Script sent in for patient. 

## 2016-10-23 ENCOUNTER — Other Ambulatory Visit: Payer: Self-pay | Admitting: Family Medicine

## 2016-12-21 ENCOUNTER — Encounter: Payer: Self-pay | Admitting: Family Medicine
